# Patient Record
Sex: Male | Born: 1975 | Race: White | Hispanic: No | Marital: Single | Smoking: Never smoker
Health system: Southern US, Community
[De-identification: ages and names within clinical notes are randomized; demographics above are authoritative.]

## PROBLEM LIST (undated history)

## (undated) DIAGNOSIS — U071 COVID-19: Secondary | ICD-10-CM

## (undated) DIAGNOSIS — L649 Androgenic alopecia, unspecified: Secondary | ICD-10-CM

## (undated) HISTORY — DX: COVID-19: U07.1

## (undated) HISTORY — DX: Androgenic alopecia, unspecified: L64.9

---

## 2011-01-11 ENCOUNTER — Ambulatory Visit (INDEPENDENT_AMBULATORY_CARE_PROVIDER_SITE_OTHER): Payer: 59 | Admitting: Family Medicine

## 2011-01-11 DIAGNOSIS — J069 Acute upper respiratory infection, unspecified: Secondary | ICD-10-CM

## 2016-12-17 ENCOUNTER — Encounter: Payer: Self-pay | Admitting: Vascular Surgery

## 2016-12-23 ENCOUNTER — Encounter: Payer: Self-pay | Admitting: Vascular Surgery

## 2018-04-22 ENCOUNTER — Encounter: Payer: Self-pay | Admitting: Primary Care

## 2018-04-24 ENCOUNTER — Ambulatory Visit: Payer: Self-pay | Admitting: Primary Care

## 2018-04-29 ENCOUNTER — Ambulatory Visit: Payer: No Typology Code available for payment source | Admitting: Primary Care

## 2018-04-29 ENCOUNTER — Encounter: Payer: Self-pay | Admitting: Primary Care

## 2018-04-29 ENCOUNTER — Encounter (INDEPENDENT_AMBULATORY_CARE_PROVIDER_SITE_OTHER): Payer: Self-pay

## 2018-04-29 VITALS — BP 130/76 | HR 80 | Temp 98.7°F | Ht 67.5 in | Wt 150.8 lb

## 2018-04-29 DIAGNOSIS — L649 Androgenic alopecia, unspecified: Secondary | ICD-10-CM

## 2018-04-29 DIAGNOSIS — Z0000 Encounter for general adult medical examination without abnormal findings: Secondary | ICD-10-CM | POA: Diagnosis not present

## 2018-04-29 MED ORDER — FINASTERIDE 1 MG PO TABS: 1.0000 mg | ORAL_TABLET | Freq: Every day | ORAL | 3 refills | Status: DC

## 2018-04-29 NOTE — Assessment & Plan Note (Signed)
He will check on Tetanus status. Gets annual flu shots. Recommended he incorporate vegetables, fruit, whole grains into diet. Commended him on regular exercise.  Exam unremarkable.  Labs pending, he will return when fasting. Follow up in 1 year for CPE.

## 2018-04-29 NOTE — Patient Instructions (Signed)
Continue exercising. You should be getting 150 minutes of moderate intensity exercise weekly.  It's important to improve your diet by increasing consumption of fresh vegetables and fruits, whole grains.  Ensure you are drinking 64 ounces of water daily.  Schedule a lab only appointment to return fasting for labs. You may have water and black coffee only.  Follow up in 1 year for your annual exam.  It was a pleasure to meet you today! Please don't hesitate to call or message me with any questions. Welcome to [facility]!   Preventive Care 40-64 Years, Male Preventive care refers to lifestyle choices and visits with your health care provider that can promote health and wellness. What does preventive care include?  A yearly physical exam. This is also called an annual well check.  Dental exams once or twice a year.  Routine eye exams. Ask your health care provider how often you should have your eyes checked.  Personal lifestyle choices, including: ? Daily care of your teeth and gums. ? Regular physical activity. ? Eating a healthy diet. ? Avoiding tobacco and drug use. ? Limiting alcohol use. ? Practicing safe sex. ? Taking low-dose aspirin every day starting at age 66. What happens during an annual well check? The services and screenings done by your health care provider during your annual well check will depend on your age, overall health, lifestyle risk factors, and family history of disease. Counseling Your health care provider may ask you questions about your:  Alcohol use.  Tobacco use.  Drug use.  Emotional well-being.  Home and relationship well-being.  Sexual activity.  Eating habits.  Work and work Statistician.  Screening You may have the following tests or measurements:  Height, weight, and BMI.  Blood pressure.  Lipid and cholesterol levels. These may be checked every 5 years, or more frequently if you are over 66 years old.  Skin check.  Lung  cancer screening. You may have this screening every year starting at age 25 if you have a 30-pack-year history of smoking and currently smoke or have quit within the past 15 years.  Fecal occult blood test (FOBT) of the stool. You may have this test every year starting at age 37.  Flexible sigmoidoscopy or colonoscopy. You may have a sigmoidoscopy every 5 years or a colonoscopy every 10 years starting at age 65.  Prostate cancer screening. Recommendations will vary depending on your family history and other risks.  Hepatitis C blood test.  Hepatitis B blood test.  Sexually transmitted disease (STD) testing.  Diabetes screening. This is done by checking your blood sugar (glucose) after you have not eaten for a while (fasting). You may have this done every 1-3 years.  Discuss your test results, treatment options, and if necessary, the need for more tests with your health care provider. Vaccines Your health care provider may recommend certain vaccines, such as:  Influenza vaccine. This is recommended every year.  Tetanus, diphtheria, and acellular pertussis (Tdap, Td) vaccine. You may need a Td booster every 10 years.  Varicella vaccine. You may need this if you have not been vaccinated.  Zoster vaccine. You may need this after age 65.  Measles, mumps, and rubella (MMR) vaccine. You may need at least one dose of MMR if you were born in 1957 or later. You may also need a second dose.  Pneumococcal 13-valent conjugate (PCV13) vaccine. You may need this if you have certain conditions and have not been vaccinated.  Pneumococcal polysaccharide (PPSV23) vaccine.  You may need one or two doses if you smoke cigarettes or if you have certain conditions.  Meningococcal vaccine. You may need this if you have certain conditions.  Hepatitis A vaccine. You may need this if you have certain conditions or if you travel or work in places where you may be exposed to hepatitis A.  Hepatitis B vaccine.  You may need this if you have certain conditions or if you travel or work in places where you may be exposed to hepatitis B.  Haemophilus influenzae type b (Hib) vaccine. You may need this if you have certain risk factors.  Talk to your health care provider about which screenings and vaccines you need and how often you need them. This information is not intended to replace advice given to you by your health care provider. Make sure you discuss any questions you have with your health care provider. Document Released: 12/15/2015 Document Revised: 08/07/2016 Document Reviewed: 09/19/2015 Elsevier Interactive Patient Education  [employer].

## 2018-04-29 NOTE — Progress Notes (Signed)
Subjective:    Patient ID: Stanley Ponce, male    DOB: 1975/12/18, 42 y.o.   MRN: [id number]  HPI  [name] is a 42 year old male who presents today to establish care, medication refill, and physical.   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 He is needing a refill today.    Immunizations: -Tetanus: Unsure -Influenza: Completes annually    Diet: He endorses a fair diet. Breakfast: Steak, eggs Lunch: Steak Dinner: Steak Snacks: None Desserts: None Beverages: Selter water, occasional alcohol  Exercise: Works out 5 days weekly at [employer] exam: Completed years ago Dental exam: No recent exam    Review of Systems  Constitutional: Negative for unexpected weight change.  HENT: Negative for rhinorrhea.   Respiratory: Negative for cough and shortness of breath.   Cardiovascular: Negative for chest pain.  Gastrointestinal: Negative for constipation and diarrhea.  Genitourinary: Negative for difficulty urinating.  Musculoskeletal: Negative for arthralgias and myalgias.  Skin: Negative for rash.  Allergic/Immunologic: Negative for environmental allergies.  Neurological: Negative for dizziness, numbness and headaches.  Psychiatric/Behavioral: The patient is not nervous/anxious.        Past Medical History:  Diagnosis Date  . Androgenic alopecia      Social History   Socioeconomic History  . Marital status: Married    Spouse name: Not on file  . Number of children: Not on file  . Years of education: Not on file  . Highest education level: Not on file  Occupational History  . Not on file  Social Needs  . Financial resource strain: Not on file  . Food insecurity:    Worry: Not on file    Inability: Not on file  . Transportation needs:    Medical: Not on file    Non-medical: Not on file  Tobacco Use  . Smoking status: Never Smoker  . Smokeless tobacco: Never Used  Substance and Sexual  Activity  . Alcohol use: Never    Frequency: Never  . Drug use: Not on file  . Sexual activity: Not on file  Lifestyle  . Physical activity:    Days per week: Not on file    Minutes per session: Not on file  . Stress: Not on file  Relationships  . Social connections:    Talks on phone: Not on file    Gets together: Not on file    Attends religious service: Not on file    Active member of club or organization: Not on file    Attends meetings of clubs or organizations: Not on file    Relationship status: Not on file  . Intimate partner violence:    Fear of current or ex partner: Not on file    Emotionally abused: Not on file    Physically abused: Not on file    Forced sexual activity: Not on file  Other Topics Concern  . Not on file  Social History Narrative   Separated.    No children.   Works in physical therapy.   Enjoys exercising.    History reviewed. No pertinent surgical history.  Family History  Problem Relation Age of Onset  . Diabetes Father     No Known Allergies  No current outpatient medications on file prior to visit.   No current facility-administered medications on file prior to visit.     BP 130/76   Pulse 80   Temp 98.7 F (37.1  C) (Oral)   Ht 5' 7.5" (1.715 m)   Wt 150 lb 12 oz (68.4 kg)   SpO2 97%   BMI 23.26 kg/m    Objective:   Physical Exam  Constitutional: He is oriented to person, place, and time. He appears well-nourished.  HENT:  Mouth/Throat: No oropharyngeal exudate.  Eyes: Pupils are equal, round, and reactive to light. EOM are normal.  Neck: Neck supple. No thyromegaly present.  Cardiovascular: Normal rate and regular rhythm.  Respiratory: Effort normal and breath sounds normal.  GI: Soft. Bowel sounds are normal. There is no tenderness.  Musculoskeletal: Normal range of motion.  Neurological: He is alert and oriented to person, place, and time.  Skin: Skin is warm and dry.  Psychiatric: He has a normal mood and  affect.           Assessment & Plan:

## 2018-04-29 NOTE — Assessment & Plan Note (Signed)
Chronic for years, doing well on Propecia. Refills sent to pharmacy. CMP pending.

## 2018-05-04 ENCOUNTER — Other Ambulatory Visit (INDEPENDENT_AMBULATORY_CARE_PROVIDER_SITE_OTHER): Payer: No Typology Code available for payment source

## 2018-05-04 DIAGNOSIS — Z0000 Encounter for general adult medical examination without abnormal findings: Secondary | ICD-10-CM | POA: Diagnosis not present

## 2018-05-04 LAB — COMPREHENSIVE METABOLIC PANEL
ALT: 25 U/L (ref 0–53)
AST: 26 U/L (ref 0–37)
Albumin: 4.4 g/dL (ref 3.5–5.2)
Alkaline Phosphatase: 62 U/L (ref 39–117)
BUN: 35 mg/dL — AB (ref 6–23)
CHLORIDE: 101 meq/L (ref 96–112)
CO2: 30 meq/L (ref 19–32)
CREATININE: 0.84 mg/dL (ref 0.40–1.50)
Calcium: 9.3 mg/dL (ref 8.4–10.5)
GFR: 105.41 mL/min (ref >60.00)
Glucose, Bld: 93 mg/dL (ref 70–99)
Potassium: 4.1 mEq/L (ref 3.5–5.1)
SODIUM: 137 meq/L (ref 135–145)
Total Bilirubin: 0.5 mg/dL (ref 0.2–1.2)
Total Protein: 6.6 g/dL (ref 6.0–8.3)

## 2018-05-04 LAB — LIPID PANEL
CHOL/HDL RATIO: 4
Cholesterol: 303 mg/dL — ABNORMAL HIGH (ref 0–200)
HDL: 73.1 mg/dL (ref >39.00)
LDL CALC: 220 mg/dL — AB (ref 0–99)
NONHDL: 230.01
Triglycerides: 49 mg/dL (ref 0.0–149.0)
VLDL: 9.8 mg/dL (ref 0.0–40.0)

## 2018-06-01 ENCOUNTER — Telehealth: Payer: Self-pay | Admitting: Primary Care

## 2018-06-01 ENCOUNTER — Telehealth: Payer: Self-pay

## 2018-06-01 DIAGNOSIS — E7800 Pure hypercholesterolemia, unspecified: Secondary | ICD-10-CM

## 2018-06-01 NOTE — Telephone Encounter (Signed)
Left message asking pt to call office  °

## 2018-06-01 NOTE — Telephone Encounter (Signed)
PLEASE NOTE: All timestamps contained within this report are represented as [place]. CONFIDENTIALTY NOTICE: This fax transmission is intended only for the addressee. It contains information that is legally privileged, confidential or otherwise protected from use or disclosure. If you are not the intended recipient, you are strictly prohibited from reviewing, disclosing, copying using or disseminating any of this information or taking any action in reliance on or regarding this information. If you have received this fax in error, please notify us immediately by telephone so that we can arrange for its return to us. Phone: [phone], Toll-Free: [phone], Fax: [phone] Page: 1 of 1 Call Id: [id number] [facility] Night - Client Nonclinical Telephone Record [facility] Client [facility] - Client [facility] - Night Physician [facility] - PHYSICIAN, Crissie FiguresUNKNOWN- MD Contact Type Call Who Is Calling Patient / Member / Family / Caregiver Caller Name Stanley Ponce Caller Phone Number [phone] Patient Name Stanley Ponce Patient DOB 1976/07/16 Call Type Message Only Information Provided Reason for Call Request to Schedule Office Appointment Initial Comment Caller says he had blood last month and wants to make an apt on Tuesday to have it rechecked. No Sx. Additional Comment Advised to cb first thing in the morning on Monday. Call Closed By: Einar Pheasantosie Noriega Transaction Date/Time: 05/30/2018 2:55:19 PM (ET)

## 2018-06-01 NOTE — Addendum Note (Signed)
Addended by: Lorre MunroeBAITY, Corianna Avallone W on: 06/01/2018 11:32 AM   Modules accepted: Orders

## 2018-06-01 NOTE — Telephone Encounter (Signed)
Pt aware Stanley Ponce will be out of office until 7/10 Already send message to chan and Stanley Ponce Pt stated he has changed his diet

## 2018-06-01 NOTE — Telephone Encounter (Signed)
Spoke to pt he is aware Jae Direkate will be out off till 7/10 He stated he is not taking any meds but he has changed his diet and wanted to see if his chol numbers better

## 2018-06-01 NOTE — Telephone Encounter (Signed)
Lipid panel ordered, can make lab only appt

## 2018-06-01 NOTE — Telephone Encounter (Signed)
Copied from CRM [phone]#[id number]. Topic: Quick Communication - See Telephone Encounter >> Jun 01, 2018  9:42 AM Arlyss Gandyichardson, Yi Haugan N, NT wrote: CRM for notification. See Telephone encounter for: 06/01/18. Pt would like to see if Vernona RiegerKatherine Clark can put in a order for him to have labs drawn to see if his numbers have improved.

## 2018-06-02 NOTE — Telephone Encounter (Signed)
Patient also would like his testosterone check. Can we place the order for that too? The lab appt is on 06/03/2018

## 2018-06-02 NOTE — Telephone Encounter (Signed)
Yes, but need to know why he wants it checked. Fatigue, sexual dysfunction, decreased libido? Also, will have to have labs before 10 am

## 2018-06-03 ENCOUNTER — Other Ambulatory Visit (INDEPENDENT_AMBULATORY_CARE_PROVIDER_SITE_OTHER): Payer: No Typology Code available for payment source

## 2018-06-03 DIAGNOSIS — R7989 Other specified abnormal findings of blood chemistry: Secondary | ICD-10-CM | POA: Diagnosis not present

## 2018-06-03 DIAGNOSIS — E7800 Pure hypercholesterolemia, unspecified: Secondary | ICD-10-CM | POA: Diagnosis not present

## 2018-06-03 LAB — LIPID PANEL
CHOL/HDL RATIO: 3
CHOLESTEROL: 190 mg/dL (ref 0–200)
HDL: 65.4 mg/dL (ref >39.00)
LDL CALC: 117 mg/dL — AB (ref 0–99)
NonHDL: 124.88
TRIGLYCERIDES: 40 mg/dL (ref 0.0–149.0)
VLDL: 8 mg/dL (ref 0.0–40.0)

## 2018-06-03 LAB — TESTOSTERONE: Testosterone: 315.01 ng/dL (ref 300.00–890.00)

## 2018-06-03 NOTE — Telephone Encounter (Signed)
Testosterone was added by lab, results already reviewed, see result note

## 2018-06-03 NOTE — Telephone Encounter (Signed)
Patient stated he has bee having decreased libido.

## 2019-01-20 ENCOUNTER — Other Ambulatory Visit: Payer: Self-pay | Admitting: Primary Care

## 2019-01-20 ENCOUNTER — Ambulatory Visit (INDEPENDENT_AMBULATORY_CARE_PROVIDER_SITE_OTHER): Payer: No Typology Code available for payment source | Admitting: Primary Care

## 2019-01-20 VITALS — BP 130/84 | HR 68 | Temp 97.9°F | Ht 67.5 in | Wt 162.8 lb

## 2019-01-20 DIAGNOSIS — I809 Phlebitis and thrombophlebitis of unspecified site: Secondary | ICD-10-CM | POA: Insufficient documentation

## 2019-01-20 DIAGNOSIS — N63 Unspecified lump in unspecified breast: Secondary | ICD-10-CM | POA: Diagnosis not present

## 2019-01-20 HISTORY — DX: Phlebitis and thrombophlebitis of unspecified site: I80.9

## 2019-01-20 NOTE — Patient Instructions (Signed)
Stop by the front desk and speak with Zella Ball regarding your referral to breast ultrasound.  Start Ibuprofen 600 mg three times daily or naproxen 500 mg twice daily for the inflammation and discomfort to the varicose vein.  Please notify me if you see increased redness (especially to the calf), swelling, pain to the back of the calf.  It was a pleasure to see you today!

## 2019-01-20 NOTE — Assessment & Plan Note (Signed)
Evident on exam over left lower extremity varicose vein. Do not suspect DVT.  Recommended NSAID's and cool compresses. Return precautions provided.

## 2019-01-20 NOTE — Assessment & Plan Note (Addendum)
Evident under right nipple. No mass under left nipple. He is on finasteride so given this we will check an ultrasound.  Ultrasound of the right breast pending.

## 2019-01-20 NOTE — Progress Notes (Signed)
Subjective:    Patient ID: Stanley Ponce, male    DOB: 07-29-1976, 43 y.o.   MRN: [id number]  HPI  Stanley Ponce is a 43 year old male who presents today with a chief complaint of nipple pain and mass.  1) Nipple Pain/Mass: Located to the right nipple that he first noticed three weeks ago. The lump is noticed with palpitation only, mild tenderness. He denies redness, swelling. He self diagnosed himself with tendonitis to his bilateral elbows and biceps, these symptoms have nearly resolved. He denies a family history of breast cancer. He does exercises regularly, denies taking steroids or testosterone.    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 He woke up this morning and noticed a patch of erythema with discomfort over the left lateral calf over his varicose vein. He denies calf pain, swelling, long travel. He's not been taking anything for his symptoms.   Review of Systems  Constitutional: Negative for fever.  Skin: Positive for color change. Negative for wound.       Nipple mass  Neurological: Negative for numbness.       Past Medical History:  Diagnosis Date  . Androgenic alopecia      Social History   Socioeconomic History  . Marital status: Married    Spouse name: Not on file  . Number of children: Not on file  . Years of education: Not on file  . Highest education level: Not on file  Occupational History  . Not on file  Social Needs  . Financial resource strain: Not on file  . Food insecurity:    Worry: Not on file    Inability: Not on file  . Transportation needs:    Medical: Not on file    Non-medical: Not on file  Tobacco Use  . Smoking status: Never Smoker  . Smokeless tobacco: Never Used  Substance and Sexual Activity  . Alcohol use: Never    Frequency: Never  . Drug use: Not on file  . Sexual activity: Not on file  Lifestyle  . Physical activity:    Days per week: Not on file    Minutes per session: Not on file  . Stress: Not on file    Relationships  . Social connections:    Talks on phone: Not on file    Gets together: Not on file    Attends religious service: Not on file    Active member of club or organization: Not on file    Attends meetings of clubs or organizations: Not on file    Relationship status: Not on file  . Intimate partner violence:    Fear of current or ex partner: Not on file    Emotionally abused: Not on file    Physically abused: Not on file    Forced sexual activity: Not on file  Other Topics Concern  . Not on file  Social History Narrative   Separated.    No children.   Works in physical therapy.   Enjoys exercising.    No past surgical history on file.  Family History  Problem Relation Age of Onset  . Diabetes Father     No Known Allergies  Current Outpatient Medications on File Prior to Visit  Medication Sig Dispense Refill  . finasteride (PROPECIA) 1 MG tablet Take 1 tablet (1 mg total) by mouth daily. 90 tablet 3   No current facility-administered medications on file prior to visit.     BP 130/84  Pulse 68   Temp 97.9 F (36.6 C) (Oral)   Ht 5' 7.5" (1.715 m)   Wt 162 lb 12 oz (73.8 kg)   SpO2 99%   BMI 25.11 kg/m    Objective:   Physical Exam  Constitutional: He appears well-nourished.  Cardiovascular: Normal rate.  No posterior calf swelling or tenderness. Numerous varicose veins noted to bilateral lower extremities.   Respiratory: Right breast exhibits mass. Right breast exhibits no nipple discharge, no skin change and no tenderness. Left breast exhibits no mass, no nipple discharge, no skin change and no tenderness.    Mass is under right nipple  Skin: Skin is warm and dry. There is erythema.  Mild erythema to left lateral calf over varicose vein. Tender upon palpation.            Assessment & Plan:

## 2019-01-22 ENCOUNTER — Other Ambulatory Visit: Payer: No Typology Code available for payment source

## 2019-01-22 ENCOUNTER — Other Ambulatory Visit: Payer: Self-pay | Admitting: Primary Care

## 2019-01-22 DIAGNOSIS — N63 Unspecified lump in unspecified breast: Secondary | ICD-10-CM

## 2019-01-25 ENCOUNTER — Telehealth: Payer: Self-pay | Admitting: Primary Care

## 2019-01-25 DIAGNOSIS — M79605 Pain in left leg: Secondary | ICD-10-CM

## 2019-01-25 NOTE — Telephone Encounter (Signed)
Please thank him for the update. Is he willing to have an ultrasound of the leg to rule out DVT? I think it's the next best step.

## 2019-01-25 NOTE — Telephone Encounter (Signed)
Best number [phone]  I called pt regarding his ultra sound for his breast.   And he wanted me to let you know the spot on his leg is still swollen

## 2019-01-25 NOTE — Telephone Encounter (Signed)
Noted, order placed. 

## 2019-01-25 NOTE — Telephone Encounter (Signed)
Per DPR, left detail message of Kate Clark's comments for patient to call back 

## 2019-01-25 NOTE — Telephone Encounter (Signed)
Spoke to pt, he stated he thought [place] was in network but he wanted to double check with insurance company.  He said to give him a few days and he would call me back to schedule appointment

## 2019-01-25 NOTE — Telephone Encounter (Signed)
Patient called back and stated that he is agreeable to the ultrasound of the leg.

## 2019-01-25 NOTE — Telephone Encounter (Signed)
Left message asking pt to call office regarding ultra sound  Please transfer to Robin   Mammogram and ultra sound scheduled for 2/21  Per appointment notes pt canceled appointment  See below reason for cancelation  Patient (PT'S INSURANCE IS OUT OF NETWORK, WILL CONTACT PROVIDER TO SEE WHAT TO DO NEXT AND POSSIBLY CHECK W/PARTICULAR PLAN FOR IN NETWORK PROVIDER. GAVE HIM [phone] AND [phone] AS WELL AS [phone] TO GIVE TO INSURANCE)

## 2019-01-29 NOTE — Telephone Encounter (Signed)
Patient made appointment  [place] 3/4

## 2019-02-01 ENCOUNTER — Other Ambulatory Visit (INDEPENDENT_AMBULATORY_CARE_PROVIDER_SITE_OTHER): Payer: No Typology Code available for payment source

## 2019-02-01 DIAGNOSIS — R0602 Shortness of breath: Secondary | ICD-10-CM

## 2019-02-01 DIAGNOSIS — I83813 Varicose veins of bilateral lower extremities with pain: Secondary | ICD-10-CM

## 2019-02-02 ENCOUNTER — Telehealth: Payer: Self-pay | Admitting: Primary Care

## 2019-02-02 LAB — D-DIMER, QUANTITATIVE: D-Dimer, Quant: 0.99 mcg/mL FEU — ABNORMAL HIGH (ref <0.50)

## 2019-02-02 NOTE — Telephone Encounter (Signed)
Called patient to discuss d-dimer results and ED precautions, no answer. Voice mail left discussing to review my chart message. ED precautions provided, will await ultrasound report.

## 2019-02-03 ENCOUNTER — Ambulatory Visit
Admission: RE | Admit: 2019-02-03 | Discharge: 2019-02-03 | Disposition: A | Discharge status: Home / Self Care | Payer: PRIVATE HEALTH INSURANCE | Source: Ambulatory Visit | Attending: Primary Care | Admitting: Primary Care

## 2019-02-03 ENCOUNTER — Ambulatory Visit (INDEPENDENT_AMBULATORY_CARE_PROVIDER_SITE_OTHER): Payer: PRIVATE HEALTH INSURANCE

## 2019-02-03 DIAGNOSIS — N63 Unspecified lump in unspecified breast: Secondary | ICD-10-CM | POA: Diagnosis not present

## 2019-02-03 DIAGNOSIS — M79605 Pain in left leg: Secondary | ICD-10-CM | POA: Diagnosis not present

## 2019-02-03 NOTE — Progress Notes (Signed)
Patient ID: Stanley Ponce, male   DOB: October 22, 1976, 43 y.o.   MRN: [id number]    Left lower venous has been completed and is negative for DVT. Preliminary results can be found under CV proc through chart review.   Dondra Prader RVT [facility]

## 2019-04-17 ENCOUNTER — Other Ambulatory Visit: Payer: Self-pay | Admitting: Primary Care

## 2019-04-17 DIAGNOSIS — L649 Androgenic alopecia, unspecified: Secondary | ICD-10-CM

## 2019-07-16 ENCOUNTER — Other Ambulatory Visit: Payer: Self-pay | Admitting: Primary Care

## 2019-07-16 DIAGNOSIS — L649 Androgenic alopecia, unspecified: Secondary | ICD-10-CM

## 2019-08-08 ENCOUNTER — Other Ambulatory Visit: Payer: Self-pay

## 2019-08-08 DIAGNOSIS — L649 Androgenic alopecia, unspecified: Secondary | ICD-10-CM

## 2019-08-10 MED ORDER — FINASTERIDE 1 MG PO TABS: 1.0000 mg | ORAL_TABLET | Freq: Every day | ORAL | 0 refills | Status: DC

## 2019-09-06 ENCOUNTER — Other Ambulatory Visit: Payer: Self-pay | Admitting: Primary Care

## 2019-09-06 DIAGNOSIS — L649 Androgenic alopecia, unspecified: Secondary | ICD-10-CM

## 2019-09-20 ENCOUNTER — Other Ambulatory Visit: Payer: Self-pay | Admitting: Primary Care

## 2019-09-20 DIAGNOSIS — Z0000 Encounter for general adult medical examination without abnormal findings: Secondary | ICD-10-CM

## 2019-09-27 ENCOUNTER — Telehealth: Payer: Self-pay

## 2019-09-27 NOTE — Telephone Encounter (Signed)
LVM w COVID screen, back lab and front door info  

## 2019-09-27 NOTE — Telephone Encounter (Signed)
[facility] Night - Client TELEPHONE ADVICE RECORD [facility] Patient Name: Stanley Ponce Gender: Male DOB: 09-29-1976 Age: 43 Y 7 M Return Phone Number: [phone] (Primary) Address: City/State/Zip: [place] [address] Client [facility] Night - Client Client Site [facility] - Night Physician Vernona Rieger - NP Contact Type Call Who Is Calling Patient / Member / Family / Caregiver Call Type Triage / Clinical Relationship To Patient Self Return Phone Number [phone] (Primary) Chief Complaint Health information question (non symptomatic) Reason for Call Symptomatic / Request for Health Information Initial Comment Caller states that he has lab appointment and needs to do the Covid-19 prescreening before he can go into the building on the 09/29/2019. Caller is non symptomatic. Translation No Nurse Assessment Nurse: Terrence Dupont, RN, Holly Date/Time (Eastern Time): 09/24/2019 6:54:55 PM Confirm and document reason for call. If symptomatic, describe symptoms. ---Caller states that he has lab appointment and needs to do the Covid-19 prescreening before he can go into the building on the 09/29/2019. Caller is non symptomatic. caller states he gets tested weekly for COVID. no resident at the facility is positive at this time. Has the patient had close contact with a person known or suspected to have the novel coronavirus illness OR traveled / lives in area with major community spread (including international travel) in the last 14 days from the onset of symptoms? * If Asymptomatic, screen for exposure and travel within the last 14 days. ---No Does the patient have any new or worsening symptoms? ---No Guidelines Guideline Title Affirmed Question Affirmed Notes Nurse Date/Time (Eastern Time) Coronavirus (COVID-19) - Exposure COVID -19 Disease, questions about McClarnon, RN, [facility] 09/24/2019 6:56:18  PM Disp. Time Lamount Cohen Time) Disposition Final User 09/24/2019 6:37:19 PM Send To RN Personal Mendel Corning, RN, Kaila 09/24/2019 6:59:16 PM Home Care Yes McClarnon, RN, Sharene Butters Disagree/Comply Comply PLEASE NOTE: All timestamps contained within this report are represented as [place] Standard Time. CONFIDENTIALTY NOTICE: This fax transmission is intended only for the addressee. It contains information that is legally privileged, confidential or otherwise protected from use or disclosure. If you are not the intended recipient, you are strictly prohibited from reviewing, disclosing, copying using or disseminating any of this information or taking any action in reliance on or regarding this information. If you have received this fax in error, please notify us immediately by telephone so that we can arrange for its return to [place]. Phone: [phone], Toll-Free: [phone], Fax: [phone] Page: 2 of 2 Call Id: [id number] Caller Understands Yes PreDisposition Did not know what to do Care Advice Given Per Guideline * LIVING IN OR TRAVEL FROM A CITY or area where there is community spread of COVID-19. This carries a lower risk compared to close contact if one follows physical distancing recommendations. Community spread is now occurring in most of the [place], especially in cities. * CLOSE CONTACT WITH A PERSON who tested positive for COVID-19 AND contact occurred while they were ill. * Here are the main risk factors for getting sick with COVID-19. COVID-19 - EXPOSURE RISK FACTORS: * You have more questions. CALL BACK IF: CARE ADVICE given per Coronavirus (COVID-19) - Exposure (Adult) guideline. Comments User: Donzetta Matters, RN Date/Time Lamount Cohen Time): 09/24/2019 7:00:17 PM Instructed when he comes to the office on Wednesday they will take his temperature an ask him if he has any symptoms. That is the screening before he has his labs drawn. User: Donzetta Matters, RN Date/Time Lamount Cohen Time):  09/24/2019 7:01:10 PM  Caller states he is tested weekly for COVID at his Job on Thursday's he has always been negative

## 2019-09-29 ENCOUNTER — Other Ambulatory Visit (INDEPENDENT_AMBULATORY_CARE_PROVIDER_SITE_OTHER): Payer: PRIVATE HEALTH INSURANCE

## 2019-09-29 DIAGNOSIS — Z0000 Encounter for general adult medical examination without abnormal findings: Secondary | ICD-10-CM | POA: Diagnosis not present

## 2019-09-29 LAB — COMPREHENSIVE METABOLIC PANEL
ALT: 50 U/L (ref 0–53)
AST: 33 U/L (ref 0–37)
Albumin: 4.6 g/dL (ref 3.5–5.2)
Alkaline Phosphatase: 80 U/L (ref 39–117)
BUN: 36 mg/dL — ABNORMAL HIGH (ref 6–23)
CO2: 28 mEq/L (ref 19–32)
Calcium: 9.6 mg/dL (ref 8.4–10.5)
Chloride: 102 mEq/L (ref 96–112)
Creatinine, Ser: 0.94 mg/dL (ref 0.40–1.50)
GFR: 86.55 mL/min (ref >60.00)
Glucose, Bld: 77 mg/dL (ref 70–99)
Potassium: 4.7 mEq/L (ref 3.5–5.1)
Sodium: 138 mEq/L (ref 135–145)
Total Bilirubin: 0.7 mg/dL (ref 0.2–1.2)
Total Protein: 6.5 g/dL (ref 6.0–8.3)

## 2019-09-29 LAB — CBC
HCT: 44.7 % (ref 39.0–52.0)
Hemoglobin: 14.7 g/dL (ref 13.0–17.0)
MCHC: 32.9 g/dL (ref 30.0–36.0)
MCV: 91.4 fl (ref 78.0–100.0)
Platelets: 184 10*3/uL (ref 150.0–400.0)
RBC: 4.89 Mil/uL (ref 4.22–5.81)
RDW: 12.9 % (ref 11.5–15.5)
WBC: 3.2 10*3/uL — ABNORMAL LOW (ref 4.0–10.5)

## 2019-09-29 LAB — LIPID PANEL
Cholesterol: 217 mg/dL — ABNORMAL HIGH (ref 0–200)
HDL: 56.9 mg/dL (ref >39.00)
LDL Cholesterol: 149 mg/dL — ABNORMAL HIGH (ref 0–99)
NonHDL: 160.13
Total CHOL/HDL Ratio: 4
Triglycerides: 58 mg/dL (ref 0.0–149.0)
VLDL: 11.6 mg/dL (ref 0.0–40.0)

## 2019-10-02 ENCOUNTER — Other Ambulatory Visit: Payer: Self-pay | Admitting: Primary Care

## 2019-10-02 DIAGNOSIS — L649 Androgenic alopecia, unspecified: Secondary | ICD-10-CM

## 2019-10-06 ENCOUNTER — Encounter: Payer: Self-pay | Admitting: Primary Care

## 2019-10-06 ENCOUNTER — Other Ambulatory Visit: Payer: Self-pay

## 2019-10-06 ENCOUNTER — Ambulatory Visit (INDEPENDENT_AMBULATORY_CARE_PROVIDER_SITE_OTHER): Payer: PRIVATE HEALTH INSURANCE | Admitting: Primary Care

## 2019-10-06 VITALS — BP 122/80 | HR 65 | Temp 97.4°F | Ht 67.5 in | Wt 149.8 lb

## 2019-10-06 DIAGNOSIS — Z0000 Encounter for general adult medical examination without abnormal findings: Secondary | ICD-10-CM | POA: Diagnosis not present

## 2019-10-06 DIAGNOSIS — E785 Hyperlipidemia, unspecified: Secondary | ICD-10-CM | POA: Diagnosis not present

## 2019-10-06 DIAGNOSIS — D72819 Decreased white blood cell count, unspecified: Secondary | ICD-10-CM | POA: Insufficient documentation

## 2019-10-06 DIAGNOSIS — L649 Androgenic alopecia, unspecified: Secondary | ICD-10-CM

## 2019-10-06 DIAGNOSIS — Z23 Encounter for immunization: Secondary | ICD-10-CM | POA: Diagnosis not present

## 2019-10-06 MED ORDER — FINASTERIDE 1 MG PO TABS: 1.0000 mg | ORAL_TABLET | Freq: Every day | ORAL | 3 refills | Status: DC

## 2019-10-06 NOTE — Assessment & Plan Note (Addendum)
Chronic, managed on finasteride for 15 years, doing well. Continue same. Refills sent to pharmacy. Check PSA in 2 months.

## 2019-10-06 NOTE — Progress Notes (Signed)
Subjective:    Patient ID: Stanley Ponce, male    DOB: 1976/10/11, 43 y.o.   MRN: [id number]  HPI  Stanley Ponce is a 43 year old male who presents today for complete physical.  Immunizations: -Tetanus: Due -Influenza: Completed this season   Diet: He endorses a fair diet overall.  Exercise: He is doing strength training 5 days weekly, no cardio.  Eye exam: No recent exam Dental exam: No recent exam  Colonoscopy: Never completed, declines due to Covid-19  BP Readings from Last 3 Encounters:  10/06/19 122/80  01/20/19 130/84  04/29/18 130/76     Review of Systems  Constitutional: Negative for unexpected weight change.  HENT: Negative for rhinorrhea.   Respiratory: Negative for cough and shortness of breath.   Cardiovascular: Negative for chest pain.  Gastrointestinal: Negative for constipation and diarrhea.  Genitourinary: Negative for difficulty urinating.  Musculoskeletal: Negative for arthralgias and myalgias.  Skin: Negative for rash.  Allergic/Immunologic: Negative for environmental allergies.  Neurological: Negative for dizziness, numbness and headaches.  Psychiatric/Behavioral: The patient is not nervous/anxious.        Past Medical History:  Diagnosis Date  . Androgenic alopecia      Social History   Socioeconomic History  . Marital status: Married    Spouse name: Not on file  . Number of children: Not on file  . Years of education: Not on file  . Highest education level: Not on file  Occupational History  . Not on file  Social Needs  . Financial resource strain: Not on file  . Food insecurity    Worry: Not on file    Inability: Not on file  . Transportation needs    Medical: Not on file    Non-medical: Not on file  Tobacco Use  . Smoking status: Never Smoker  . Smokeless tobacco: Never Used  Substance and Sexual Activity  . Alcohol use: Never    Frequency: Never  . Drug use: Not on file  . Sexual activity: Not on file  Lifestyle   . Physical activity    Days per week: Not on file    Minutes per session: Not on file  . Stress: Not on file  Relationships  . Social Herbalist on phone: Not on file    Gets together: Not on file    Attends religious service: Not on file    Active member of club or organization: Not on file    Attends meetings of clubs or organizations: Not on file    Relationship status: Not on file  . Intimate partner violence    Fear of current or ex partner: Not on file    Emotionally abused: Not on file    Physically abused: Not on file    Forced sexual activity: Not on file  Other Topics Concern  . Not on file  Social History Narrative   Separated.    No children.   Works in physical therapy.   Enjoys exercising.    No past surgical history on file.  Family History  Problem Relation Age of Onset  . Diabetes Father   . Breast cancer Neg Hx     No Known Allergies  Current Outpatient Medications on File Prior to Visit  Medication Sig Dispense Refill  . finasteride (PROPECIA) 1 MG tablet TAKE 1 TABLET (1 MG TOTAL) BY MOUTH DAILY. DUE FOR A FOLLOW UP APPOINTMENT 30 tablet 0   No current facility-administered medications on file prior  to visit.     BP 122/80   Pulse 65   Temp (!) 97.4 F (36.3 C) (Temporal)   Ht 5' 7.5" (1.715 m)   Wt 149 lb 12 oz (67.9 kg)   SpO2 100%   BMI 23.11 kg/m    Objective:   Physical Exam  Constitutional: He is oriented to person, place, and time. He appears well-nourished.  HENT:  Right Ear: Tympanic membrane and ear canal normal.  Left Ear: Tympanic membrane and ear canal normal.  Mouth/Throat: Oropharynx is clear and moist.  Eyes: Pupils are equal, round, and reactive to light. EOM are normal.  Neck: Neck supple.  Cardiovascular: Normal rate and regular rhythm.  Respiratory: Effort normal and breath sounds normal.  GI: Soft. Bowel sounds are normal. There is no abdominal tenderness.  Musculoskeletal: Normal range of motion.   Neurological: He is alert and oriented to person, place, and time.  Skin: Skin is warm and dry.  Psychiatric: He has a normal mood and affect.           Assessment & Plan:

## 2019-10-06 NOTE — Assessment & Plan Note (Signed)
Tetanus due, provided today. Influenza vaccination UTD. Colonoscopy due, he declines due to Covid-19, will address again next year. Encouraged a healthy diet and regular exercise. Exam today unremarkable.  Labs reviewed.

## 2019-10-06 NOTE — Assessment & Plan Note (Addendum)
Noted on recent labs with WBC count of 3.2. Based off of records from 2018 WBC count was 9.4. Will repeat in 2 months. Asymptomatic.

## 2019-10-06 NOTE — Addendum Note (Signed)
Addended by: Jacqualin Combes on: 10/06/2019 09:39 AM   Modules accepted: Orders

## 2019-10-06 NOTE — Assessment & Plan Note (Addendum)
LDL of 149 on recent labs which is drastically improved from 220 last year. 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 Discussed to initiate cardiovascular exercise during work out routine. Continue to monitor.

## 2019-10-06 NOTE — Patient Instructions (Signed)
Continue exercising. You should be getting 150 minutes of moderate intensity exercise weekly.  Continue to work on a healthy diet. Ensure you are consuming 64 ounces of water daily.  Schedule a lab only appointment for 2 months for repeat blood counts.  It was a pleasure to see you today!   Preventive Care 86-45 Years Old, Male Preventive care refers to lifestyle choices and visits with your health care provider that can promote health and wellness. This includes:  A yearly physical exam. This is also called an annual well check.  Regular dental and eye exams.  Immunizations.  Screening for certain conditions.  Healthy lifestyle choices, such as eating a healthy diet, getting regular exercise, not using drugs or products that contain nicotine and tobacco, and limiting alcohol use. What can I expect for my preventive care visit? Physical exam Your health care provider will check:  Height and weight. These may be used to calculate body mass index (BMI), which is a measurement that tells if you are at a healthy weight.  Heart rate and blood pressure.  Your skin for abnormal spots. Counseling Your health care provider may ask you questions about:  Alcohol, tobacco, and drug use.  Emotional well-being.  Home and relationship well-being.  Sexual activity.  Eating habits.  Work and work Statistician. What immunizations do I need?  Influenza (flu) vaccine  This is recommended every year. Tetanus, diphtheria, and pertussis (Tdap) vaccine  You may need a Td booster every 10 years. Varicella (chickenpox) vaccine  You may need this vaccine if you have not already been vaccinated. Zoster (shingles) vaccine  You may need this after age 45. Measles, mumps, and rubella (MMR) vaccine  You may need at least one dose of MMR if you were born in 1957 or later. You may also need a second dose. Pneumococcal conjugate (PCV13) vaccine  You may need this if you have certain  conditions and were not previously vaccinated. Pneumococcal polysaccharide (PPSV23) vaccine  You may need one or two doses if you smoke cigarettes or if you have certain conditions. Meningococcal conjugate (MenACWY) vaccine  You may need this if you have certain conditions. Hepatitis A vaccine  You may need this if you have certain conditions or if you travel or work in places where you may be exposed to hepatitis A. Hepatitis B vaccine  You may need this if you have certain conditions or if you travel or work in places where you may be exposed to hepatitis B. Haemophilus influenzae type b (Hib) vaccine  You may need this if you have certain risk factors. Human papillomavirus (HPV) vaccine  If recommended by your health care provider, you may need three doses over 6 months. You may receive vaccines as individual doses or as more than one vaccine together in one shot (combination vaccines). Talk with your health care provider about the risks and benefits of combination vaccines. What tests do I need? Blood tests  Lipid and cholesterol levels. These may be checked every 5 years, or more frequently if you are over 40 years old.  Hepatitis C test.  Hepatitis B test. Screening  Lung cancer screening. You may have this screening every year starting at age 45 if you have a 30-pack-year history of smoking and currently smoke or have quit within the past 15 years.  Prostate cancer screening. Recommendations will vary depending on your family history and other risks.  Colorectal cancer screening. All adults should have this screening starting at age 45 and  continuing until age 45. Your health care provider may recommend screening at age 45 if you are at increased risk. You will have tests every 1-10 years, depending on your results and the type of screening test.  Diabetes screening. This is done by checking your blood sugar (glucose) after you have not eaten for a while (fasting). You may  have this done every 1-3 years.  Sexually transmitted disease (STD) testing. Follow these instructions at home: Eating and drinking  Eat a diet that includes fresh fruits and vegetables, whole grains, lean protein, and low-fat dairy products.  Take vitamin and mineral supplements as recommended by your health care provider.  Do not drink alcohol if your health care provider tells you not to drink.  If you drink alcohol: ? Limit how much you have to 0-2 drinks a day. ? Be aware of how much alcohol is in your drink. In the [place] one drink equals one 12 oz bottle of beer (355 mL), one 5 oz glass of wine (148 mL), or one 1 oz glass of hard liquor (44 mL). Lifestyle  Take daily care of your teeth and gums.  Stay active. Exercise for at least 30 minutes on 5 or more days each week.  Do not use any products that contain nicotine or tobacco, such as cigarettes, e-cigarettes, and chewing tobacco. If you need help quitting, ask your health care provider.  If you are sexually active, practice safe sex. Use a condom or other form of protection to prevent STIs (sexually transmitted infections).  Talk with your health care provider about taking a low-dose aspirin every day starting at age 45. What's next?  Go to your health care provider once a year for a well check visit.  Ask your health care provider how often you should have your eyes and teeth checked.  Stay up to date on all vaccines. This information is not intended to replace advice given to you by your health care provider. Make sure you discuss any questions you have with your health care provider. Document Released: 12/15/2015 Document Revised: 11/12/2018 Document Reviewed: 11/12/2018 Elsevier Patient Education  2020 [employer].

## 2019-12-01 ENCOUNTER — Telehealth: Payer: Self-pay

## 2019-12-01 NOTE — Telephone Encounter (Signed)
LVM w COVID screen and back lab info 12.30.2020 TLJ 

## 2019-12-07 ENCOUNTER — Other Ambulatory Visit: Payer: Self-pay

## 2019-12-07 ENCOUNTER — Other Ambulatory Visit (INDEPENDENT_AMBULATORY_CARE_PROVIDER_SITE_OTHER): Payer: PRIVATE HEALTH INSURANCE

## 2019-12-07 DIAGNOSIS — L649 Androgenic alopecia, unspecified: Secondary | ICD-10-CM | POA: Diagnosis not present

## 2019-12-07 DIAGNOSIS — D72819 Decreased white blood cell count, unspecified: Secondary | ICD-10-CM

## 2019-12-07 LAB — CBC
HCT: 43.8 % (ref 39.0–52.0)
Hemoglobin: 14.6 g/dL (ref 13.0–17.0)
MCHC: 33.4 g/dL (ref 30.0–36.0)
MCV: 91 fl (ref 78.0–100.0)
Platelets: 154 10*3/uL (ref 150.0–400.0)
RBC: 4.81 Mil/uL (ref 4.22–5.81)
RDW: 14.3 % (ref 11.5–15.5)
WBC: 4.1 10*3/uL (ref 4.0–10.5)

## 2019-12-07 LAB — PSA: PSA: 0.12 ng/mL (ref 0.10–4.00)

## 2019-12-08 ENCOUNTER — Other Ambulatory Visit (INDEPENDENT_AMBULATORY_CARE_PROVIDER_SITE_OTHER): Payer: PRIVATE HEALTH INSURANCE

## 2019-12-08 DIAGNOSIS — E785 Hyperlipidemia, unspecified: Secondary | ICD-10-CM

## 2019-12-08 LAB — LIPID PANEL
Cholesterol: 207 mg/dL — ABNORMAL HIGH (ref 0–200)
HDL: 74.3 mg/dL (ref >39.00)
LDL Cholesterol: 124 mg/dL — ABNORMAL HIGH (ref 0–99)
NonHDL: 132.67
Total CHOL/HDL Ratio: 3
Triglycerides: 43 mg/dL (ref 0.0–149.0)
VLDL: 8.6 mg/dL (ref 0.0–40.0)

## 2020-08-16 DIAGNOSIS — L649 Androgenic alopecia, unspecified: Secondary | ICD-10-CM

## 2020-08-17 ENCOUNTER — Other Ambulatory Visit: Payer: Self-pay

## 2020-08-17 DIAGNOSIS — L649 Androgenic alopecia, unspecified: Secondary | ICD-10-CM

## 2020-08-17 MED ORDER — FINASTERIDE 1 MG PO TABS: 1.0000 mg | ORAL_TABLET | Freq: Every day | ORAL | 0 refills | Status: DC

## 2020-08-17 NOTE — Telephone Encounter (Signed)
Fax received patient on the road and would like 90 day supply sent to [facility] in [place]. I have pended new script with correct pharmacy.

## 2020-09-02 ENCOUNTER — Other Ambulatory Visit: Payer: Self-pay | Admitting: Primary Care

## 2020-09-02 DIAGNOSIS — L649 Androgenic alopecia, unspecified: Secondary | ICD-10-CM

## 2021-01-18 IMAGING — US ULTRASOUND RIGHT BREAST LIMITED
1 series · 9 of 9 positions shown · non-contrast
Comparison: Previous exam(s).

ACR Breast Density Category a: The breast tissue is almost entirely
fatty.

CLINICAL DATA: Right subareolar breast area of palpable concern and
tenderness.

EXAM:
DIGITAL DIAGNOSTIC BILATERAL MAMMOGRAM WITH CAD AND TOMO
ULTRASOUND RIGHT BREAST

[Series 1: ultrasound right breast limited · 0.06mm/px · 9 of 9 slices shown]
[im 1/9]
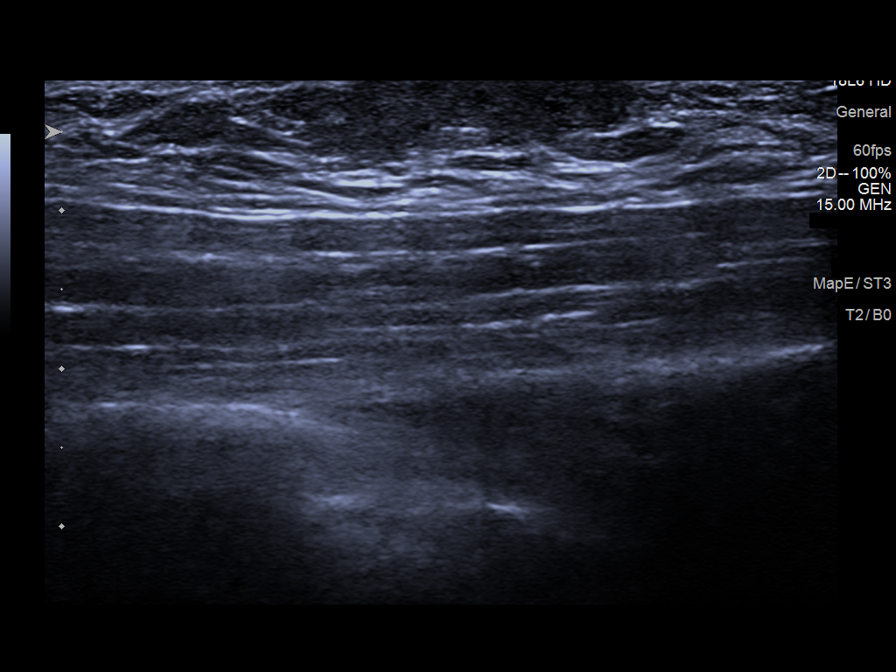
[im 2/9]
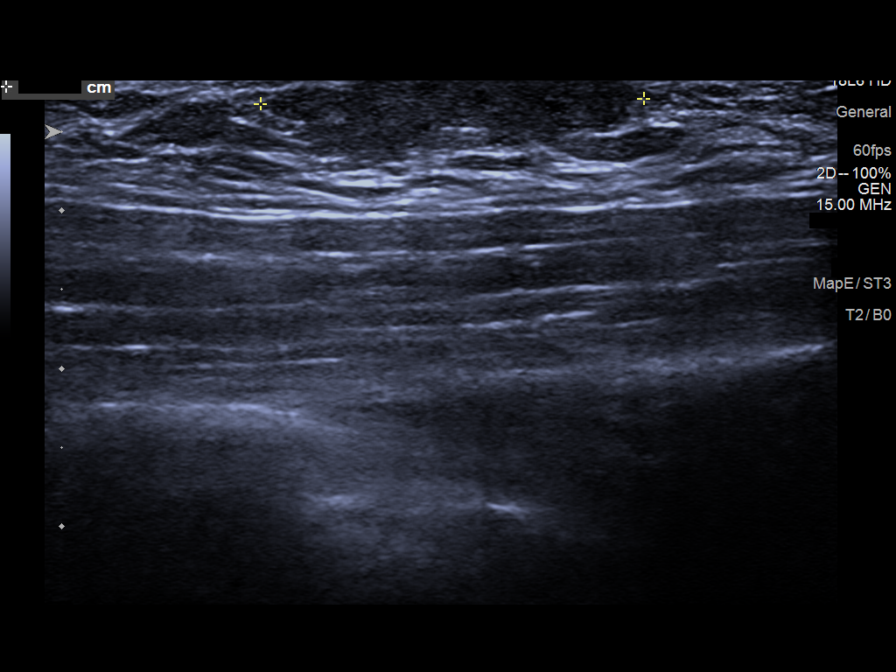
[im 3/9]
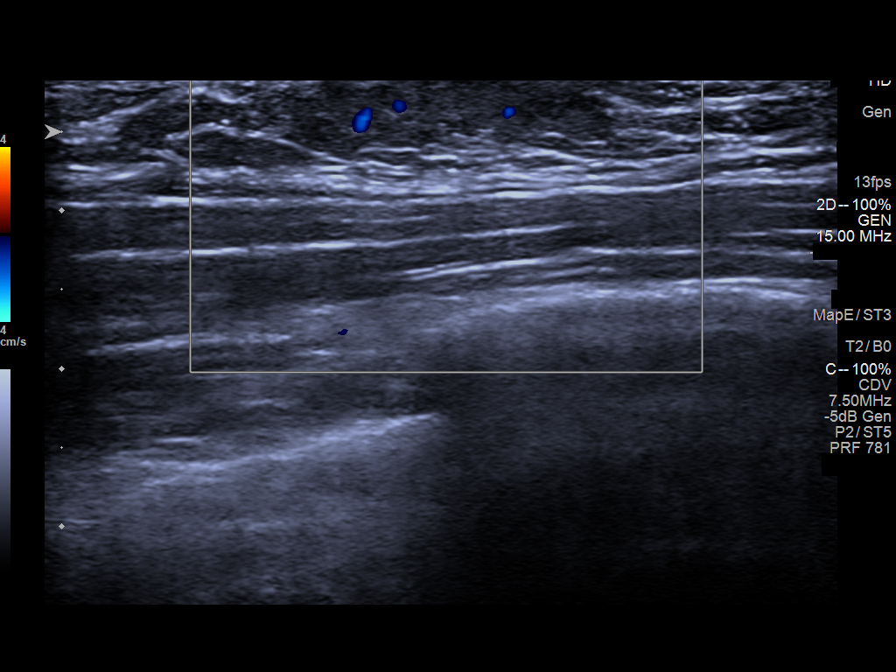
[im 4/9]
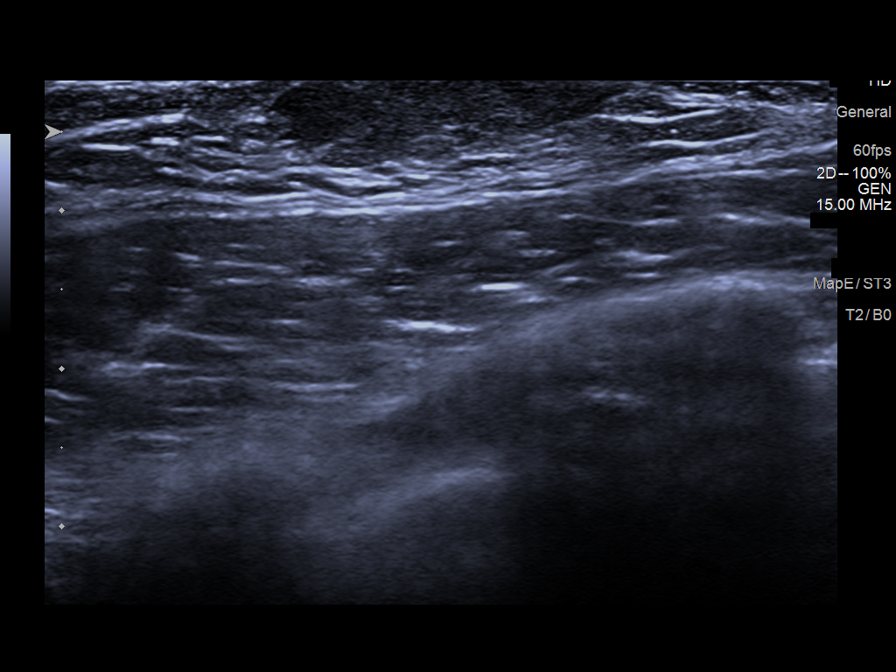
[im 5/9]
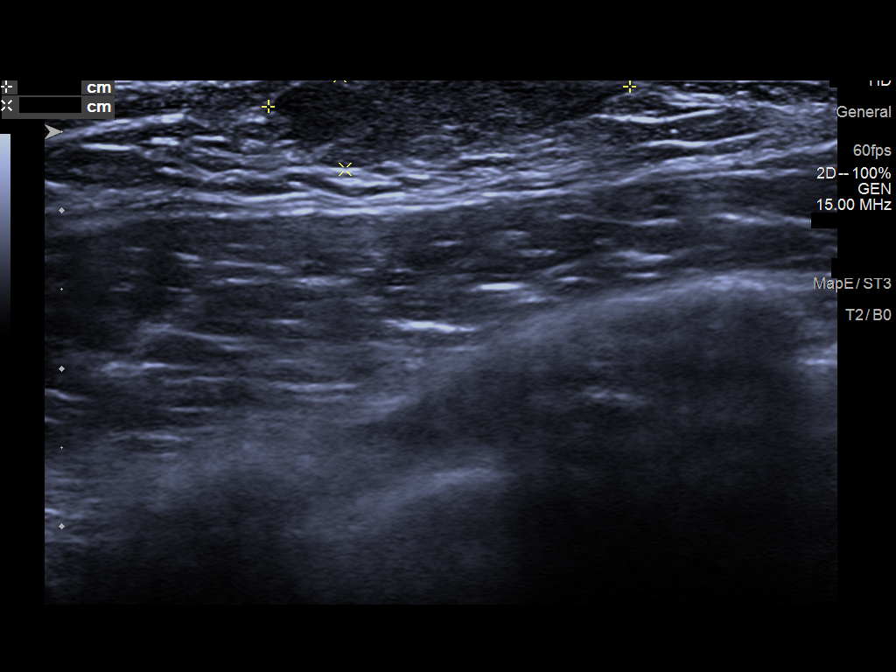
[im 6/9]
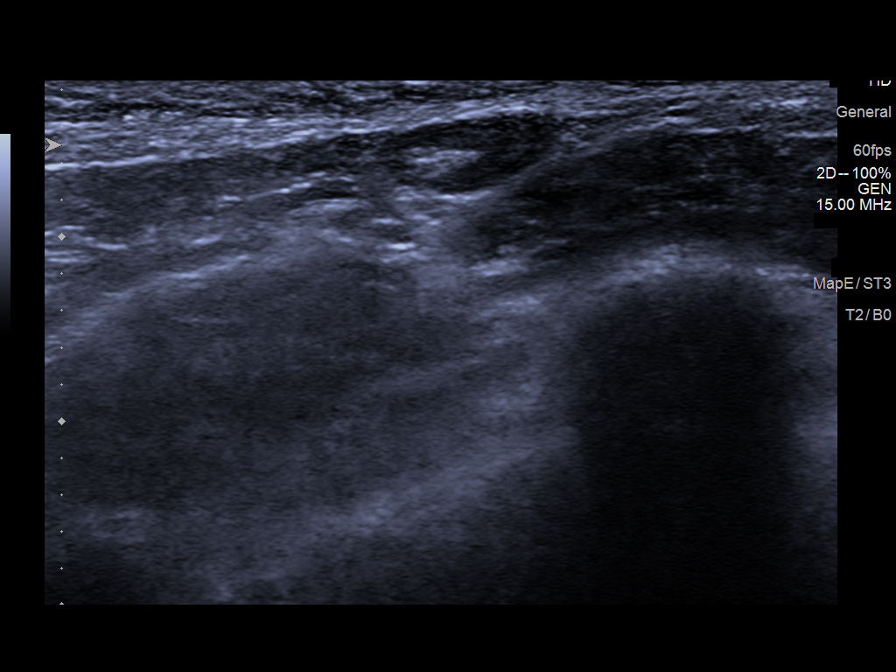
[im 7/9]
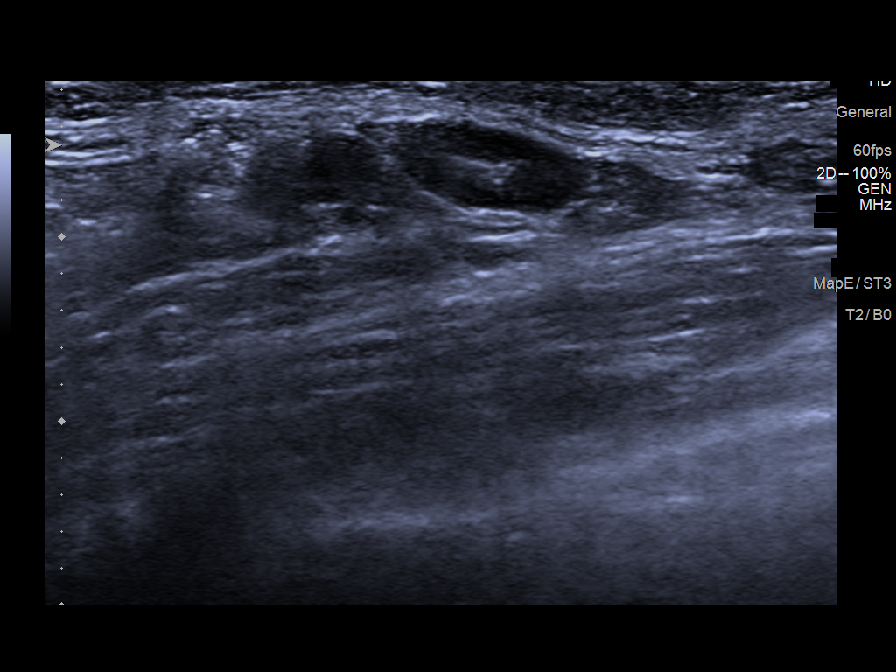
[im 8/9]
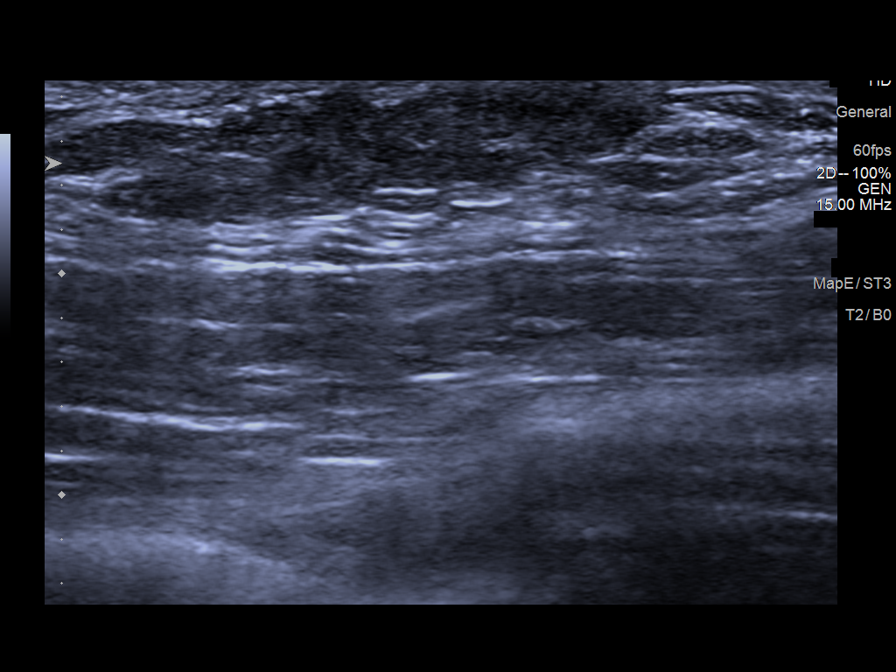
[im 9/9]
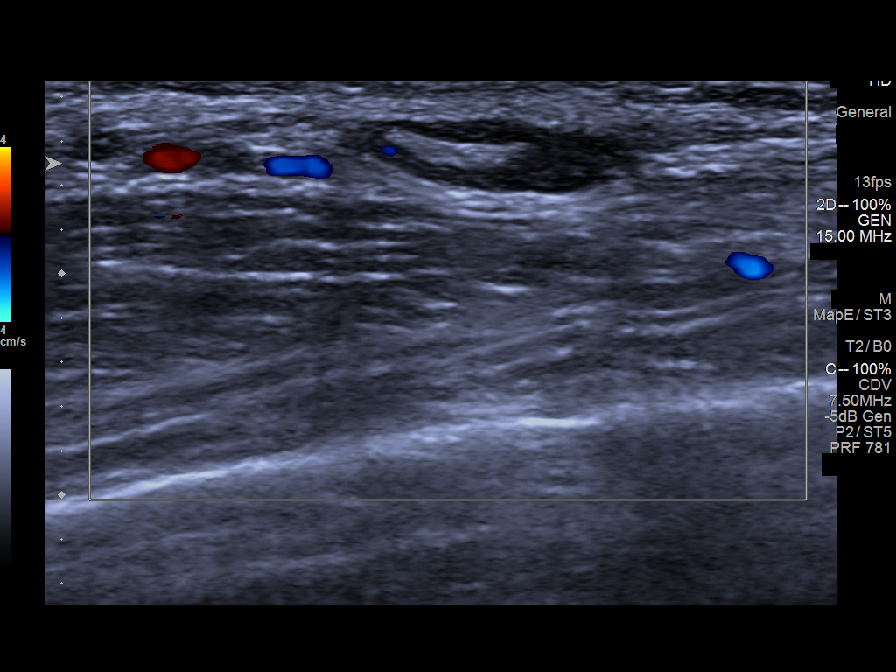

[9 of 9 positions shown; findings below may reference images not displayed]

FINDINGS: Mammographically, there are no suspicious masses, areas of
architectural distortion or microcalcifications in the left breast.
There is a low-density focal asymmetry in the right subareolar
breast, which corresponds to the area of palpable concern. Slightly
prominent right axillary lymph node is noted.

Mammographic images were processed with CAD.

On physical exam, there is a soft palpable area in the subareolar
right breast.

Targeted right breast ultrasound is performed, showing a small
amount of breast tissue development in the immediate subareolar
right breast, consistent with gynecomastia. No suspicious masses are
seen.

Examination of the right axilla demonstrates normal-appearing lymph
nodes.
IMPRESSION: Asymmetric right breast gynecomastia.

No mammographic evidence of malignancy in either breast.

RECOMMENDATION:
Clinical follow-up.

I have discussed the findings and recommendations with the patient.
Results were also provided in writing at the conclusion of the
visit. If applicable, a reminder letter will be sent to the patient
regarding the next appointment.

BI-RADS CATEGORY  2: Benign.

## 2021-01-18 IMAGING — MG DIGITAL DIAGNOSTIC BILATERAL MAMMOGRAM WITH TOMO AND CAD
6 of 12 series · 6 of 36 positions shown · non-contrast
Comparison: Previous exam(s).

ACR Breast Density Category a: The breast tissue is almost entirely
fatty.

CLINICAL DATA: Right subareolar breast area of palpable concern and
tenderness.

EXAM:
DIGITAL DIAGNOSTIC BILATERAL MAMMOGRAM WITH CAD AND TOMO
ULTRASOUND RIGHT BREAST

[R CC synth-2D (1 of 2)]
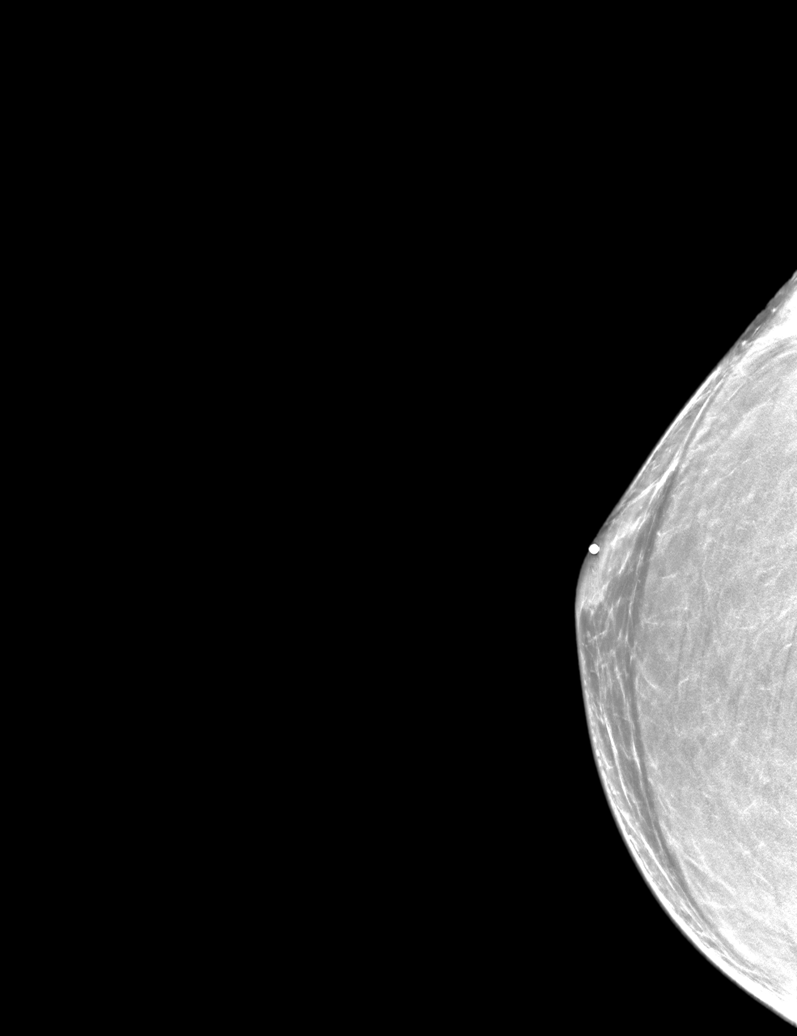

[R CC synth-2D (2 of 2)]
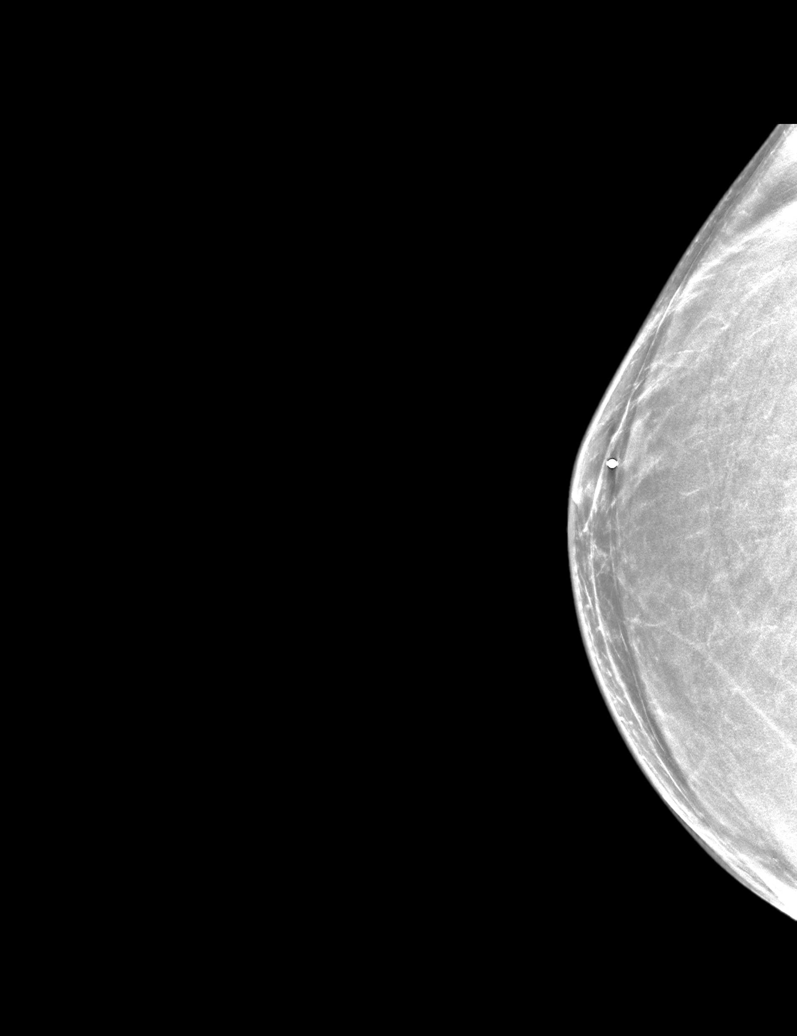

[R TAN synth-2D]
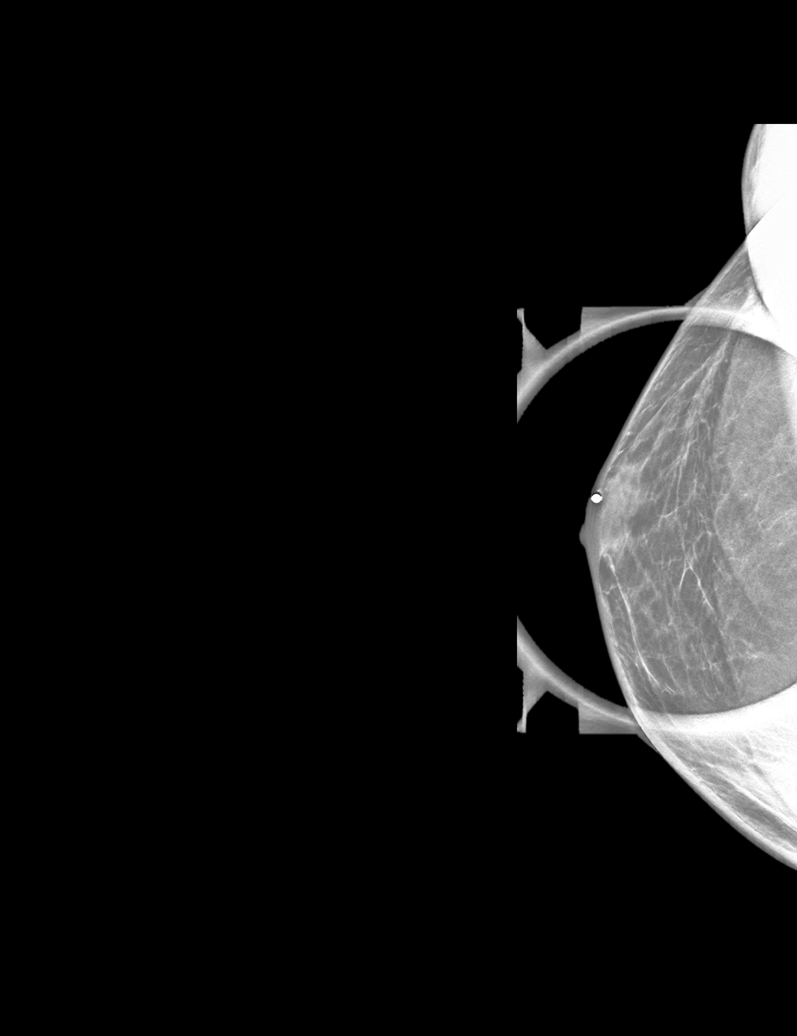

[R MLO synth-2D]
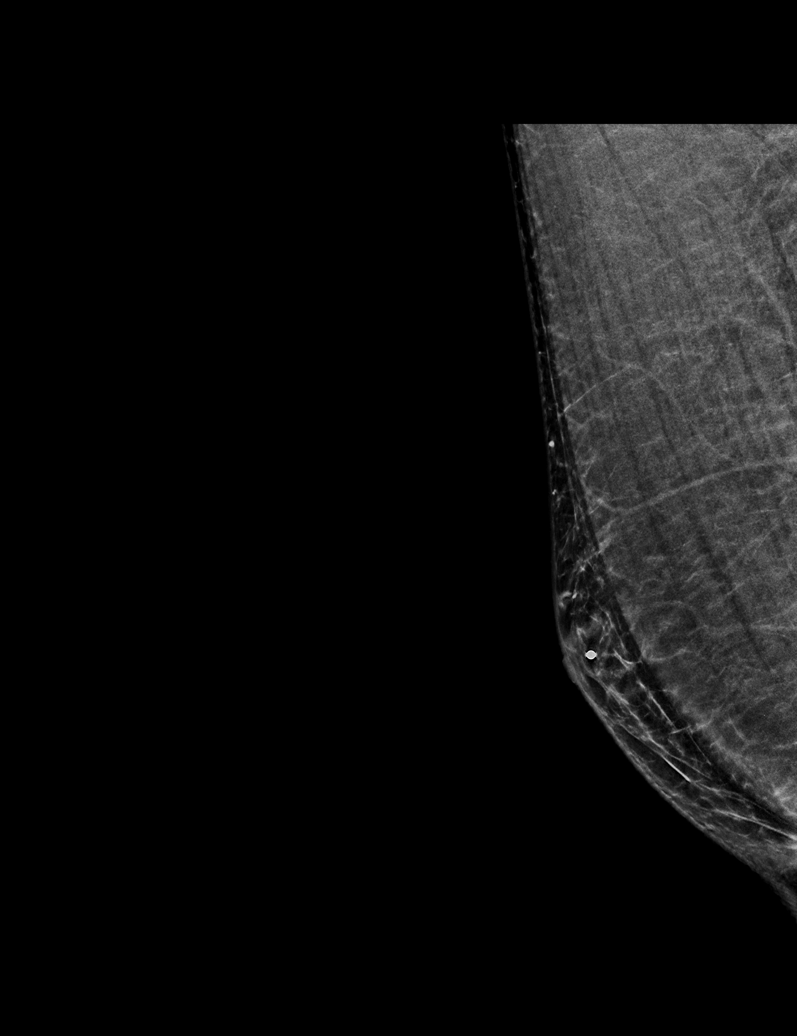

[L MLO synth-2D]
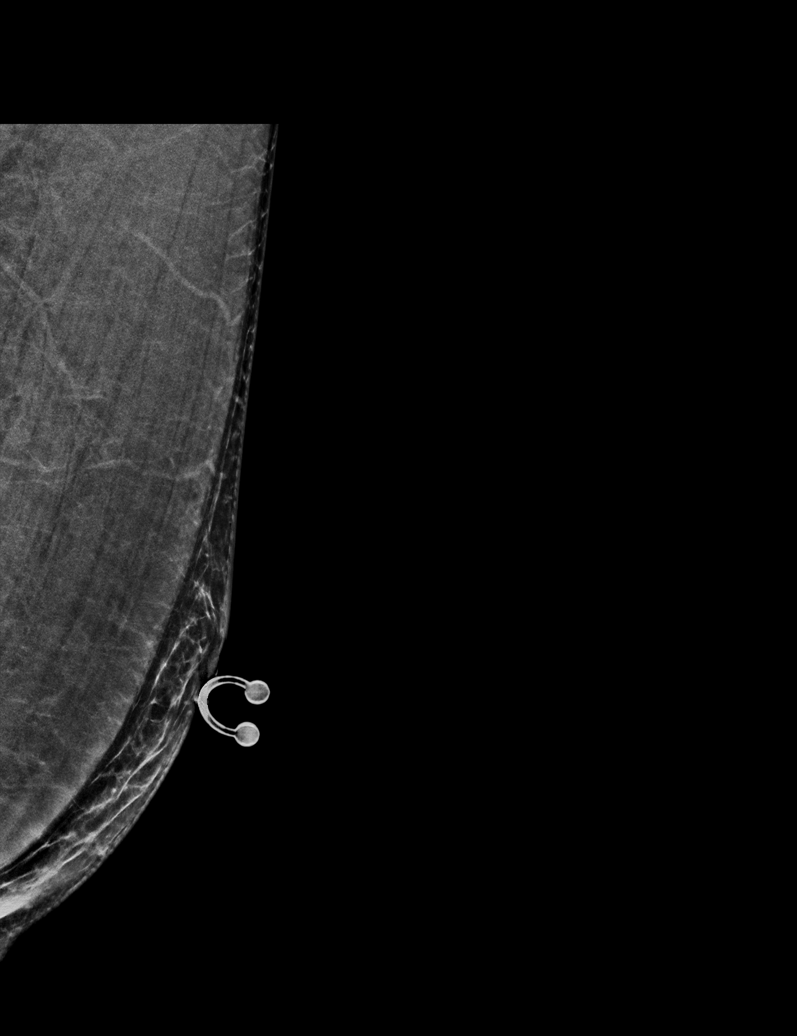

[L CC synth-2D]
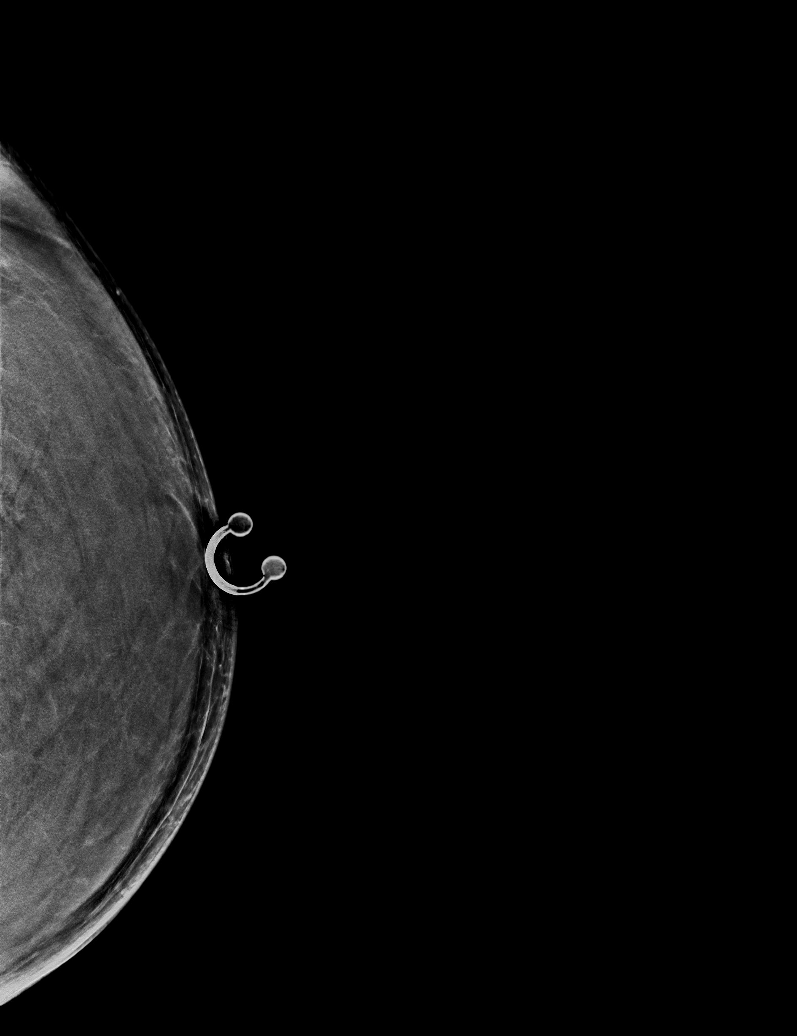

[6 of 36 positions shown; findings below may reference images not displayed]

FINDINGS: Mammographically, there are no suspicious masses, areas of
architectural distortion or microcalcifications in the left breast.
There is a low-density focal asymmetry in the right subareolar
breast, which corresponds to the area of palpable concern. Slightly
prominent right axillary lymph node is noted.

Mammographic images were processed with CAD.

On physical exam, there is a soft palpable area in the subareolar
right breast.

Targeted right breast ultrasound is performed, showing a small
amount of breast tissue development in the immediate subareolar
right breast, consistent with gynecomastia. No suspicious masses are
seen.

Examination of the right axilla demonstrates normal-appearing lymph
nodes.
IMPRESSION: Asymmetric right breast gynecomastia.

No mammographic evidence of malignancy in either breast.

RECOMMENDATION:
Clinical follow-up.

I have discussed the findings and recommendations with the patient.
Results were also provided in writing at the conclusion of the
visit. If applicable, a reminder letter will be sent to the patient
regarding the next appointment.

BI-RADS CATEGORY  2: Benign.

## 2021-01-29 ENCOUNTER — Ambulatory Visit: Payer: PRIVATE HEALTH INSURANCE | Admitting: Primary Care

## 2021-01-31 ENCOUNTER — Ambulatory Visit: Payer: PRIVATE HEALTH INSURANCE | Admitting: Primary Care

## 2021-01-31 ENCOUNTER — Ambulatory Visit (INDEPENDENT_AMBULATORY_CARE_PROVIDER_SITE_OTHER): Payer: BLUE CROSS/BLUE SHIELD | Admitting: Primary Care

## 2021-01-31 ENCOUNTER — Other Ambulatory Visit: Payer: Self-pay

## 2021-01-31 ENCOUNTER — Encounter: Payer: Self-pay | Admitting: Primary Care

## 2021-01-31 VITALS — BP 120/62 | HR 62 | Temp 98.8°F | Ht 67.5 in | Wt 159.0 lb

## 2021-01-31 DIAGNOSIS — Z0000 Encounter for general adult medical examination without abnormal findings: Secondary | ICD-10-CM | POA: Diagnosis not present

## 2021-01-31 DIAGNOSIS — Z114 Encounter for screening for human immunodeficiency virus [HIV]: Secondary | ICD-10-CM | POA: Diagnosis not present

## 2021-01-31 DIAGNOSIS — Z1159 Encounter for screening for other viral diseases: Secondary | ICD-10-CM | POA: Diagnosis not present

## 2021-01-31 DIAGNOSIS — E785 Hyperlipidemia, unspecified: Secondary | ICD-10-CM | POA: Diagnosis not present

## 2021-01-31 DIAGNOSIS — L649 Androgenic alopecia, unspecified: Secondary | ICD-10-CM | POA: Diagnosis not present

## 2021-01-31 DIAGNOSIS — D72819 Decreased white blood cell count, unspecified: Secondary | ICD-10-CM

## 2021-01-31 LAB — COMPREHENSIVE METABOLIC PANEL
ALT: 19 U/L (ref 0–53)
AST: 25 U/L (ref 0–37)
Albumin: 4.5 g/dL (ref 3.5–5.2)
Alkaline Phosphatase: 51 U/L (ref 39–117)
BUN: 35 mg/dL — ABNORMAL HIGH (ref 6–23)
CO2: 30 mEq/L (ref 19–32)
Calcium: 9.6 mg/dL (ref 8.4–10.5)
Chloride: 105 mEq/L (ref 96–112)
Creatinine, Ser: 1.22 mg/dL (ref 0.40–1.50)
GFR: 70.88 mL/min (ref >60.00)
Glucose, Bld: 69 mg/dL — ABNORMAL LOW (ref 70–99)
Potassium: 4.4 mEq/L (ref 3.5–5.1)
Sodium: 140 mEq/L (ref 135–145)
Total Bilirubin: 0.4 mg/dL (ref 0.2–1.2)
Total Protein: 6.4 g/dL (ref 6.0–8.3)

## 2021-01-31 LAB — LIPID PANEL
Cholesterol: 199 mg/dL (ref 0–200)
HDL: 58.3 mg/dL (ref >39.00)
LDL Cholesterol: 110 mg/dL — ABNORMAL HIGH (ref 0–99)
NonHDL: 141.03
Total CHOL/HDL Ratio: 3
Triglycerides: 155 mg/dL — ABNORMAL HIGH (ref 0.0–149.0)
VLDL: 31 mg/dL (ref 0.0–40.0)

## 2021-01-31 LAB — CBC
HCT: 42.2 % (ref 39.0–52.0)
Hemoglobin: 14.2 g/dL (ref 13.0–17.0)
MCHC: 33.6 g/dL (ref 30.0–36.0)
MCV: 91.1 fl (ref 78.0–100.0)
Platelets: 210 10*3/uL (ref 150.0–400.0)
RBC: 4.63 Mil/uL (ref 4.22–5.81)
RDW: 12.6 % (ref 11.5–15.5)
WBC: 6.2 10*3/uL (ref 4.0–10.5)

## 2021-01-31 MED ORDER — FINASTERIDE 1 MG PO TABS: 1.0000 mg | ORAL_TABLET | Freq: Every day | ORAL | 3 refills | Status: DC

## 2021-01-31 NOTE — Assessment & Plan Note (Signed)
Doing well on finasteride 1 mg daily, continue same.  Refill sent to pharmacy.

## 2021-01-31 NOTE — Assessment & Plan Note (Signed)
Repeat CBC pending. 

## 2021-01-31 NOTE — Assessment & Plan Note (Signed)
Tetanus up-to-date, declines influenza and Covid vaccinations.  Commended him on a healthy diet and regular exercise.  Exam today unremarkable. Labs pending.

## 2021-01-31 NOTE — Progress Notes (Signed)
Subjective:    Patient ID: Stanley Ponce, male    DOB: March 06, 1976, 45 y.o.   MRN: [id number]  HPI  This visit occurred during the SARS-CoV-2 public health emergency.  Safety protocols were in place, including screening questions prior to the visit, additional usage of staff PPE, and extensive cleaning of exam room while observing appropriate contact time as indicated for disinfecting solutions.   Mr. Stanley Ponce is a 45 year old male who presents today for complete physical.  Immunizations: -Tetanus: 2020 -Influenza: Declines  -Covid-19: Declines  Diet: He endorses a healthy diet.  Exercise: He is exercising regularly.   Eye exam: No recent exam Dental exam: No recent exam  Colonoscopy: Never completed, declines today. Hep C Screen: Due  BP Readings from Last 3 Encounters:  01/31/21 120/62  10/06/19 122/80  01/20/19 130/84     Review of Systems  Constitutional: Negative for unexpected weight change.  HENT: Negative for rhinorrhea.   Eyes: Negative for visual disturbance.  Respiratory: Negative for cough and shortness of breath.   Cardiovascular: Negative for chest pain.  Gastrointestinal: Negative for constipation and diarrhea.  Genitourinary: Negative for difficulty urinating.  Musculoskeletal: Negative for arthralgias and myalgias.  Skin: Negative for rash.  Allergic/Immunologic: Negative for environmental allergies.  Neurological: Negative for dizziness, numbness and headaches.  Psychiatric/Behavioral: The patient is not nervous/anxious.        Past Medical History:  Diagnosis Date  . Androgenic alopecia   . COVID-19 virus infection      Social History   Socioeconomic History  . Marital status: Single    Spouse name: Not on file  . Number of children: Not on file  . Years of education: Not on file  . Highest education level: Not on file  Occupational History  . Not on file  Tobacco Use  . Smoking status: Never Smoker  . Smokeless tobacco: Never  Used  Substance and Sexual Activity  . Alcohol use: Never  . Drug use: Not on file  . Sexual activity: Not on file  Other Topics Concern  . Not on file  Social History Narrative   Separated.    No children.   Works in physical therapy.   Enjoys exercising.   Social Determinants of Health   Financial Resource Strain: Not on file  Food Insecurity: Not on file  Transportation Needs: Not on file  Physical Activity: Not on file  Stress: Not on file  Social Connections: Not on file  Intimate Partner Violence: Not on file    History reviewed. No pertinent surgical history.  Family History  Problem Relation Age of Onset  . Diabetes Father   . Breast cancer Neg Hx     No Known Allergies  Current Outpatient Medications on File Prior to Visit  Medication Sig Dispense Refill  . finasteride (PROPECIA) 1 MG tablet Take 1 tablet (1 mg total) by mouth daily. 90 tablet 0   No current facility-administered medications on file prior to visit.    BP 120/62   Pulse 62   Temp 98.8 F (37.1 C) (Temporal)   Ht 5' 7.5" (1.715 m)   Wt 159 lb (72.1 kg)   SpO2 97%   BMI 24.54 kg/m    Objective:   Physical Exam Constitutional:      Appearance: He is well-nourished.  HENT:     Right Ear: Tympanic membrane and ear canal normal.     Left Ear: Tympanic membrane and ear canal normal.     Mouth/Throat:  Mouth: Oropharynx is clear and moist.  Eyes:     Extraocular Movements: EOM normal.     Pupils: Pupils are equal, round, and reactive to light.  Cardiovascular:     Rate and Rhythm: Normal rate and regular rhythm.  Pulmonary:     Effort: Pulmonary effort is normal.     Breath sounds: Normal breath sounds.  Abdominal:     General: Bowel sounds are normal.     Palpations: Abdomen is soft.     Tenderness: There is no abdominal tenderness.  Musculoskeletal:        General: Normal range of motion.     Cervical back: Neck supple.  Skin:    General: Skin is warm and dry.   Neurological:     Mental Status: He is alert and oriented to person, place, and time.     Cranial Nerves: No cranial nerve deficit.     Deep Tendon Reflexes:     Reflex Scores:      Patellar reflexes are 2+ on the right side and 2+ on the left side. Psychiatric:        Mood and Affect: Mood and affect normal.            Assessment & Plan:

## 2021-01-31 NOTE — Assessment & Plan Note (Signed)
Commended him on a healthy diet and regular exercise.  Repeat lipid panel pending. 

## 2021-01-31 NOTE — Patient Instructions (Signed)
Stop by the lab prior to leaving today. I will notify you of your results once received.   Continue exercising. You should be getting 150 minutes of moderate intensity exercise weekly.  Continue to work on a healthy diet. Ensure you are consuming 64 ounces of water daily.  It was a pleasure to see you today!   Preventive Care 40-47 Years Old, Male Preventive care refers to lifestyle choices and visits with your health care provider that can promote health and wellness. This includes:  A yearly physical exam. This is also called an annual wellness visit.  Regular dental and eye exams.  Immunizations.  Screening for certain conditions.  Healthy lifestyle choices, such as: ? Eating a healthy diet. ? Getting regular exercise. ? Not using drugs or products that contain nicotine and tobacco. ? Limiting alcohol use. What can I expect for my preventive care visit? Physical exam Your health care provider will check your:  Height and weight. These may be used to calculate your BMI (body mass index). BMI is a measurement that tells if you are at a healthy weight.  Heart rate and blood pressure.  Body temperature.  Skin for abnormal spots. Counseling Your health care provider may ask you questions about your:  Past medical problems.  Family's medical history.  Alcohol, tobacco, and drug use.  Emotional well-being.  Home life and relationship well-being.  Sexual activity.  Diet, exercise, and sleep habits.  Work and work environment.  Access to firearms. What immunizations do I need? Vaccines are usually given at various ages, according to a schedule. Your health care provider will recommend vaccines for you based on your age, medical history, and lifestyle or other factors, such as travel or where you work.   What tests do I need? Blood tests  Lipid and cholesterol levels. These may be checked every 5 years, or more often if you are over 50 years old.  Hepatitis C  test.  Hepatitis B test. Screening  Lung cancer screening. You may have this screening every year starting at age 55 if you have a 30-pack-year history of smoking and currently smoke or have quit within the past 15 years.  Prostate cancer screening. Recommendations will vary depending on your family history and other risks.  Genital exam to check for testicular cancer or hernias.  Colorectal cancer screening. ? All adults should have this screening starting at age 50 and continuing until age 75. ? Your health care provider may recommend screening at age 45 if you are at increased risk. ? You will have tests every 1-10 years, depending on your results and the type of screening test.  Diabetes screening. ? This is done by checking your blood sugar (glucose) after you have not eaten for a while (fasting). ? You may have this done every 1-3 years.  STD (sexually transmitted disease) testing, if you are at risk. Follow these instructions at home: Eating and drinking  Eat a diet that includes fresh fruits and vegetables, whole grains, lean protein, and low-fat dairy products.  Take vitamin and mineral supplements as recommended by your health care provider.  Do not drink alcohol if your health care provider tells you not to drink.  If you drink alcohol: ? Limit how much you have to 0-2 drinks a day. ? Be aware of how much alcohol is in your drink. In the [place] one drink equals one 12 oz bottle of beer (355 mL), one 5 oz glass of wine (148 mL), or one   1 oz glass of hard liquor (44 mL).   Lifestyle  Take daily care of your teeth and gums. Brush your teeth every morning and night with fluoride toothpaste. Floss one time each day.  Stay active. Exercise for at least 30 minutes 5 or more days each week.  Do not use any products that contain nicotine or tobacco, such as cigarettes, e-cigarettes, and chewing tobacco. If you need help quitting, ask your health care provider.  Do not use  drugs.  If you are sexually active, practice safe sex. Use a condom or other form of protection to prevent STIs (sexually transmitted infections).  If told by your health care provider, take low-dose aspirin daily starting at age 50.  Find healthy ways to cope with stress, such as: ? Meditation, yoga, or listening to music. ? Journaling. ? Talking to a trusted person. ? Spending time with friends and family. Safety  Always wear your seat belt while driving or riding in a vehicle.  Do not drive: ? If you have been drinking alcohol. Do not ride with someone who has been drinking. ? When you are tired or distracted. ? While texting.  Wear a helmet and other protective equipment during sports activities.  If you have firearms in your house, make sure you follow all gun safety procedures. What's next?  Go to your health care provider once a year for an annual wellness visit.  Ask your health care provider how often you should have your eyes and teeth checked.  Stay up to date on all vaccines. This information is not intended to replace advice given to you by your health care provider. Make sure you discuss any questions you have with your health care provider. Document Revised: 08/17/2019 Document Reviewed: 11/12/2018 Elsevier Patient Education  2021 Elsevier Inc.  

## 2021-02-01 LAB — HEPATITIS C ANTIBODY
Hepatitis C Ab: NONREACTIVE
SIGNAL TO CUT-OFF: 0.01 (ref <1.00)

## 2021-02-01 LAB — HIV ANTIBODY (ROUTINE TESTING W REFLEX): HIV 1&2 Ab, 4th Generation: NONREACTIVE

## 2021-05-01 ENCOUNTER — Other Ambulatory Visit: Payer: Self-pay | Admitting: Primary Care

## 2021-05-01 DIAGNOSIS — L649 Androgenic alopecia, unspecified: Secondary | ICD-10-CM

## 2021-05-01 NOTE — Telephone Encounter (Signed)
Will you double check his preferred pharmacy? Okay to send in as pended.

## 2021-05-02 NOTE — Telephone Encounter (Signed)
Called did need [facility]. Have sent in refill as pended

## 2022-02-04 ENCOUNTER — Other Ambulatory Visit: Payer: Self-pay | Admitting: Primary Care

## 2022-02-04 DIAGNOSIS — L649 Androgenic alopecia, unspecified: Secondary | ICD-10-CM

## 2022-02-04 NOTE — Telephone Encounter (Signed)
Patient last office visit was CPE on 01/31/21. Not seen in year. Left message to call office. Need to make CPE appointment.  ?

## 2022-02-06 NOTE — Telephone Encounter (Signed)
Noted. ?Will decline refill request at this time. ?

## 2022-02-06 NOTE — Telephone Encounter (Signed)
Called patient declined making appointment states he has a few weeks left and will call back in a week or so to make appointment.  ?

## 2022-04-12 ENCOUNTER — Ambulatory Visit (INDEPENDENT_AMBULATORY_CARE_PROVIDER_SITE_OTHER): Payer: BLUE CROSS/BLUE SHIELD | Admitting: Primary Care

## 2022-04-12 ENCOUNTER — Encounter: Payer: Self-pay | Admitting: Primary Care

## 2022-04-12 VITALS — BP 110/64 | HR 76 | Temp 98.7°F | Ht 67.5 in | Wt 164.0 lb

## 2022-04-12 DIAGNOSIS — Z1211 Encounter for screening for malignant neoplasm of colon: Secondary | ICD-10-CM | POA: Diagnosis not present

## 2022-04-12 DIAGNOSIS — Z0000 Encounter for general adult medical examination without abnormal findings: Secondary | ICD-10-CM

## 2022-04-12 DIAGNOSIS — L649 Androgenic alopecia, unspecified: Secondary | ICD-10-CM

## 2022-04-12 DIAGNOSIS — E785 Hyperlipidemia, unspecified: Secondary | ICD-10-CM | POA: Diagnosis not present

## 2022-04-12 LAB — LIPID PANEL
Cholesterol: 178 mg/dL (ref 0–200)
HDL: 51.2 mg/dL (ref >39.00)
LDL Cholesterol: 112 mg/dL — ABNORMAL HIGH (ref 0–99)
NonHDL: 127.09
Total CHOL/HDL Ratio: 3
Triglycerides: 76 mg/dL (ref 0.0–149.0)
VLDL: 15.2 mg/dL (ref 0.0–40.0)

## 2022-04-12 LAB — COMPREHENSIVE METABOLIC PANEL
ALT: 22 U/L (ref 0–53)
AST: 26 U/L (ref 0–37)
Albumin: 4.6 g/dL (ref 3.5–5.2)
Alkaline Phosphatase: 44 U/L (ref 39–117)
BUN: 18 mg/dL (ref 6–23)
CO2: 31 mEq/L (ref 19–32)
Calcium: 9.6 mg/dL (ref 8.4–10.5)
Chloride: 103 mEq/L (ref 96–112)
Creatinine, Ser: 1.21 mg/dL (ref 0.40–1.50)
GFR: 70.99 mL/min (ref >60.00)
Glucose, Bld: 75 mg/dL (ref 70–99)
Potassium: 4.2 mEq/L (ref 3.5–5.1)
Sodium: 141 mEq/L (ref 135–145)
Total Bilirubin: 0.6 mg/dL (ref 0.2–1.2)
Total Protein: 6.7 g/dL (ref 6.0–8.3)

## 2022-04-12 LAB — CBC
HCT: 40.4 % (ref 39.0–52.0)
Hemoglobin: 13.9 g/dL (ref 13.0–17.0)
MCHC: 34.3 g/dL (ref 30.0–36.0)
MCV: 93 fl (ref 78.0–100.0)
Platelets: 137 10*3/uL — ABNORMAL LOW (ref 150.0–400.0)
RBC: 4.35 Mil/uL (ref 4.22–5.81)
RDW: 13.5 % (ref 11.5–15.5)
WBC: 4.5 10*3/uL (ref 4.0–10.5)

## 2022-04-12 MED ORDER — FINASTERIDE 1 MG PO TABS: 1.0000 mg | ORAL_TABLET | Freq: Every day | ORAL | 3 refills | Status: AC

## 2022-04-12 NOTE — Assessment & Plan Note (Signed)
Controlled. ? ?Continue finasteride 1 mg daily. ? ?

## 2022-04-12 NOTE — Patient Instructions (Signed)
Stop by the lab prior to leaving today. I will notify you of your results once received.  ? ?Complete the cologuard kit once received. ? ?It was a pleasure to see you today! ? ?Preventive Care 75-48 Years Old, Male ?Preventive care refers to lifestyle choices and visits with your health care provider that can promote health and wellness. Preventive care visits are also called wellness exams. ?What can I expect for my preventive care visit? ?Counseling ?During your preventive care visit, your health care provider may ask about your: ?Medical history, including: ?Past medical problems. ?Family medical history. ?Current health, including: ?Emotional well-being. ?Home life and relationship well-being. ?Sexual activity. ?Lifestyle, including: ?Alcohol, nicotine or tobacco, and drug use. ?Access to firearms. ?Diet, exercise, and sleep habits. ?Safety issues such as seatbelt and bike helmet use. ?Sunscreen use. ?Work and work Statistician. ?Physical exam ?Your health care provider will check your: ?Height and weight. These may be used to calculate your BMI (body mass index). BMI is a measurement that tells if you are at a healthy weight. ?Waist circumference. This measures the distance around your waistline. This measurement also tells if you are at a healthy weight and may help predict your risk of certain diseases, such as type 2 diabetes and high blood pressure. ?Heart rate and blood pressure. ?Body temperature. ?Skin for abnormal spots. ?What immunizations do I need? ? ?Vaccines are usually given at various ages, according to a schedule. Your health care provider will recommend vaccines for you based on your age, medical history, and lifestyle or other factors, such as travel or where you work. ?What tests do I need? ?Screening ?Your health care provider may recommend screening tests for certain conditions. This may include: ?Lipid and cholesterol levels. ?Diabetes screening. This is done by checking your blood sugar  (glucose) after you have not eaten for a while (fasting). ?Hepatitis B test. ?Hepatitis C test. ?HIV (human immunodeficiency virus) test. ?STI (sexually transmitted infection) testing, if you are at risk. ?Lung cancer screening. ?Prostate cancer screening. ?Colorectal cancer screening. ?Talk with your health care provider about your test results, treatment options, and if necessary, the need for more tests. ?Follow these instructions at home: ?Eating and drinking ? ?Eat a diet that includes fresh fruits and vegetables, whole grains, lean protein, and low-fat dairy products. ?Take vitamin and mineral supplements as recommended by your health care provider. ?Do not drink alcohol if your health care provider tells you not to drink. ?If you drink alcohol: ?Limit how much you have to 0-2 drinks a day. ?Know how much alcohol is in your drink. In the [place] one drink equals one 12 oz bottle of beer (355 mL), one 5 oz glass of wine (148 mL), or one 1? oz glass of hard liquor (44 mL). ?Lifestyle ?Brush your teeth every morning and night with fluoride toothpaste. Floss one time each day. ?Exercise for at least 30 minutes 5 or more days each week. ?Do not use any products that contain nicotine or tobacco. These products include cigarettes, chewing tobacco, and vaping devices, such as e-cigarettes. If you need help quitting, ask your health care provider. ?Do not use drugs. ?If you are sexually active, practice safe sex. Use a condom or other form of protection to prevent STIs. ?Take aspirin only as told by your health care provider. Make sure that you understand how much to take and what form to take. Work with your health care provider to find out whether it is safe and beneficial for you to take  aspirin daily. ?Find healthy ways to manage stress, such as: ?Meditation, yoga, or listening to music. ?Journaling. ?Talking to a trusted person. ?Spending time with friends and family. ?Minimize exposure to UV radiation to reduce  your risk of skin cancer. ?Safety ?Always wear your seat belt while driving or riding in a vehicle. ?Do not drive: ?If you have been drinking alcohol. Do not ride with someone who has been drinking. ?When you are tired or distracted. ?While texting. ?If you have been using any mind-altering substances or drugs. ?Wear a helmet and other protective equipment during sports activities. ?If you have firearms in your house, make sure you follow all gun safety procedures. ?What's next? ?Go to your health care provider once a year for an annual wellness visit. ?Ask your health care provider how often you should have your eyes and teeth checked. ?Stay up to date on all vaccines. ?This information is not intended to replace advice given to you by your health care provider. Make sure you discuss any questions you have with your health care provider. ?Document Revised: 05/16/2021 Document Reviewed: 05/16/2021 ?Elsevier Patient Education ? [place]. ? ?

## 2022-04-12 NOTE — Progress Notes (Signed)
? ?Subjective:  ? ? Patient ID: Stanley Ponce, male    DOB: 01-Mar-1976, 46 y.o.   MRN: [id number] ? ?HPI ? ?Stanley Ponce is a very pleasant 46 y.o. male who presents today for complete physical and follow up of chronic conditions. ? ?Immunizations: ?-Tetanus: 2020 ?-Influenza: Did not complete last season ?-Covid-19: Never completed ? ?Diet: Healthy diet.  ?Exercise: Regular exercise. ? ?Eye exam: Completes every 2 years.  ?Dental exam: Completes semi-annually  ? ?Colonoscopy: Never completed, declined last year.  Opts for Cologuard. ? ?BP Readings from Last 3 Encounters:  ?04/12/22 110/64  ?01/31/21 120/62  ?10/06/19 122/80  ? ? ? ?Review of Systems  ?Constitutional:  Negative for unexpected weight change.  ?HENT:  Negative for rhinorrhea.   ?Respiratory:  Negative for cough and shortness of breath.   ?Cardiovascular:  Negative for chest pain.  ?Gastrointestinal:  Negative for constipation and diarrhea.  ?Genitourinary:  Negative for difficulty urinating.  ?Musculoskeletal:  Negative for arthralgias and myalgias.  ?Skin:  Negative for rash.  ?Allergic/Immunologic: Negative for environmental allergies.  ?Neurological:  Negative for dizziness and headaches.  ?Psychiatric/Behavioral:  The patient is not nervous/anxious.   ? ?   ? ? ?Past Medical History:  ?Diagnosis Date  ? Androgenic alopecia   ? COVID-19 virus infection   ? ? ?Social History  ? ?Socioeconomic History  ? Marital status: Single  ?  Spouse name: Not on file  ? Number of children: Not on file  ? Years of education: Not on file  ? Highest education level: Not on file  ?Occupational History  ? Not on file  ?Tobacco Use  ? Smoking status: Never  ? Smokeless tobacco: Never  ?Substance and Sexual Activity  ? Alcohol use: Never  ? Drug use: Not on file  ? Sexual activity: Not on file  ?Other Topics Concern  ? Not on file  ?Social History Narrative  ? Separated.   ? No children.  ? Works in physical therapy.  ? Enjoys exercising.  ? ?Social Determinants  of Health  ? ?Financial Resource Strain: Not on file  ?Food Insecurity: Not on file  ?Transportation Needs: Not on file  ?Physical Activity: Not on file  ?Stress: Not on file  ?Social Connections: Not on file  ?Intimate Partner Violence: Not on file  ? ? ?History reviewed. No pertinent surgical history. ? ?Family History  ?Problem Relation Age of Onset  ? Diabetes Father   ? Breast cancer Neg Hx   ? ? ?No Known Allergies ? ?Current Outpatient Medications on File Prior to Visit  ?Medication Sig Dispense Refill  ? finasteride (PROPECIA) 1 MG tablet TAKE 1 TABLET BY MOUTH EVERY DAY 90 tablet 2  ? ?No current facility-administered medications on file prior to visit.  ? ? ?BP 110/64   Pulse 76   Temp 98.7 ?F (37.1 ?C) (Oral)   Ht 5' 7.5" (1.715 m)   Wt 164 lb (74.4 kg)   SpO2 99%   BMI 25.31 kg/m?  ?Objective:  ? Physical Exam ?HENT:  ?   Right Ear: Tympanic membrane and ear canal normal.  ?   Left Ear: Tympanic membrane and ear canal normal.  ?   Nose: Nose normal.  ?   Right Sinus: No maxillary sinus tenderness or frontal sinus tenderness.  ?   Left Sinus: No maxillary sinus tenderness or frontal sinus tenderness.  ?Eyes:  ?   Conjunctiva/sclera: Conjunctivae normal.  ?Neck:  ?   Thyroid: No thyromegaly.  ?  Vascular: No carotid bruit.  ?Cardiovascular:  ?   Rate and Rhythm: Normal rate and regular rhythm.  ?   Heart sounds: Normal heart sounds.  ?Pulmonary:  ?   Effort: Pulmonary effort is normal.  ?   Breath sounds: Normal breath sounds. No wheezing or rales.  ?Abdominal:  ?   General: Bowel sounds are normal.  ?   Palpations: Abdomen is soft.  ?   Tenderness: There is no abdominal tenderness.  ?Musculoskeletal:     ?   General: Normal range of motion.  ?   Cervical back: Neck supple.  ?Skin: ?   General: Skin is warm and dry.  ?Neurological:  ?   Mental Status: He is alert and oriented to person, place, and time.  ?   Cranial Nerves: No cranial nerve deficit.  ?   Deep Tendon Reflexes: Reflexes are normal and  symmetric.  ?Psychiatric:     ?   Mood and Affect: Mood normal.  ? ? ? ? ? ?   ?Assessment & Plan:  ? ? ? ? ?This visit occurred during the SARS-CoV-2 public health emergency.  Safety protocols were in place, including screening questions prior to the visit, additional usage of staff PPE, and extensive cleaning of exam room while observing appropriate contact time as indicated for disinfecting solutions.  ?

## 2022-04-12 NOTE — Assessment & Plan Note (Signed)
Tetanus UTD. ? ?Colonoscopy overdue, he agrees to try Cologuard. Will place orders. ? ?Commended him on a healthy diet and regular exercise.  ? ?Exam today stable ?Labs pending ?

## 2022-05-16 ENCOUNTER — Other Ambulatory Visit: Payer: Self-pay | Admitting: Primary Care

## 2022-05-16 DIAGNOSIS — D696 Thrombocytopenia, unspecified: Secondary | ICD-10-CM

## 2022-05-31 ENCOUNTER — Other Ambulatory Visit (INDEPENDENT_AMBULATORY_CARE_PROVIDER_SITE_OTHER): Payer: BLUE CROSS/BLUE SHIELD

## 2022-05-31 DIAGNOSIS — D696 Thrombocytopenia, unspecified: Secondary | ICD-10-CM

## 2022-05-31 LAB — CBC WITH DIFFERENTIAL/PLATELET
Absolute Monocytes: 410 cells/uL (ref 200–950)
Basophils Absolute: 40 cells/uL (ref 0–200)
Basophils Relative: 0.7 %
Eosinophils Absolute: 91 cells/uL (ref 15–500)
Eosinophils Relative: 1.6 %
HCT: 40.4 % (ref 38.5–50.0)
Hemoglobin: 13.5 g/dL (ref 13.2–17.1)
Lymphs Abs: 2457 cells/uL (ref 850–3900)
MCH: 31.5 pg (ref 27.0–33.0)
MCHC: 33.4 g/dL (ref 32.0–36.0)
MCV: 94.2 fL (ref 80.0–100.0)
MPV: 12.8 fL — ABNORMAL HIGH (ref 7.5–12.5)
Monocytes Relative: 7.2 %
Neutro Abs: 2702 cells/uL (ref 1500–7800)
Neutrophils Relative %: 47.4 %
Platelets: 197 10*3/uL (ref 140–400)
RBC: 4.29 10*6/uL (ref 4.20–5.80)
RDW: 12.7 % (ref 11.0–15.0)
Total Lymphocyte: 43.1 %
WBC: 5.7 10*3/uL (ref 3.8–10.8)

## 2022-10-02 LAB — COLOGUARD: COLOGUARD: NEGATIVE

## 2022-11-11 ENCOUNTER — Ambulatory Visit: Payer: BLUE CROSS/BLUE SHIELD | Admitting: Primary Care

## 2023-12-10 ENCOUNTER — Ambulatory Visit: Payer: BLUE CROSS/BLUE SHIELD | Admitting: Primary Care

## 2023-12-10 VITALS — BP 124/78 | HR 79 | Temp 98.2°F | Ht 67.5 in | Wt 164.0 lb

## 2023-12-10 DIAGNOSIS — D72819 Decreased white blood cell count, unspecified: Secondary | ICD-10-CM

## 2023-12-10 DIAGNOSIS — E785 Hyperlipidemia, unspecified: Secondary | ICD-10-CM

## 2023-12-10 DIAGNOSIS — Z0000 Encounter for general adult medical examination without abnormal findings: Secondary | ICD-10-CM | POA: Diagnosis not present

## 2023-12-10 DIAGNOSIS — L649 Androgenic alopecia, unspecified: Secondary | ICD-10-CM | POA: Diagnosis not present

## 2023-12-10 DIAGNOSIS — D696 Thrombocytopenia, unspecified: Secondary | ICD-10-CM

## 2023-12-10 LAB — COMPREHENSIVE METABOLIC PANEL
ALT: 22 U/L (ref 0–53)
AST: 24 U/L (ref 0–37)
Albumin: 4.5 g/dL (ref 3.5–5.2)
Alkaline Phosphatase: 54 U/L (ref 39–117)
BUN: 33 mg/dL — ABNORMAL HIGH (ref 6–23)
CO2: 29 meq/L (ref 19–32)
Calcium: 9.3 mg/dL (ref 8.4–10.5)
Chloride: 105 meq/L (ref 96–112)
Creatinine, Ser: 0.92 mg/dL (ref 0.40–1.50)
GFR: 97.48 mL/min (ref >60.00)
Glucose, Bld: 88 mg/dL (ref 70–99)
Potassium: 4.3 meq/L (ref 3.5–5.1)
Sodium: 140 meq/L (ref 135–145)
Total Bilirubin: 0.4 mg/dL (ref 0.2–1.2)
Total Protein: 6.1 g/dL (ref 6.0–8.3)

## 2023-12-10 LAB — CBC WITH DIFFERENTIAL/PLATELET
Basophils Absolute: 0 10*3/uL (ref 0.0–0.1)
Basophils Relative: 0.9 % (ref 0.0–3.0)
Eosinophils Absolute: 0.1 10*3/uL (ref 0.0–0.7)
Eosinophils Relative: 2.2 % (ref 0.0–5.0)
HCT: 40.9 % (ref 39.0–52.0)
Hemoglobin: 13.4 g/dL (ref 13.0–17.0)
Lymphocytes Relative: 40.2 % (ref 12.0–46.0)
Lymphs Abs: 1.7 10*3/uL (ref 0.7–4.0)
MCHC: 32.9 g/dL (ref 30.0–36.0)
MCV: 94.5 fL (ref 78.0–100.0)
Monocytes Absolute: 0.3 10*3/uL (ref 0.1–1.0)
Monocytes Relative: 7.5 % (ref 3.0–12.0)
Neutro Abs: 2.1 10*3/uL (ref 1.4–7.7)
Neutrophils Relative %: 49.2 % (ref 43.0–77.0)
Platelets: 166 10*3/uL (ref 150.0–400.0)
RBC: 4.33 Mil/uL (ref 4.22–5.81)
RDW: 13.6 % (ref 11.5–15.5)
WBC: 4.2 10*3/uL (ref 4.0–10.5)

## 2023-12-10 LAB — IBC + FERRITIN
Ferritin: 86.1 ng/mL (ref 22.0–322.0)
Iron: 58 ug/dL (ref 42–165)
Saturation Ratios: 21.5 % (ref 20.0–50.0)
TIBC: 270.2 ug/dL (ref 250.0–450.0)
Transferrin: 193 mg/dL — ABNORMAL LOW (ref 212.0–360.0)

## 2023-12-10 LAB — LIPID PANEL
Cholesterol: 327 mg/dL — ABNORMAL HIGH (ref 0–200)
HDL: 71.2 mg/dL (ref >39.00)
LDL Cholesterol: 241 mg/dL — ABNORMAL HIGH (ref 0–99)
NonHDL: 256.01
Total CHOL/HDL Ratio: 5
Triglycerides: 77 mg/dL (ref 0.0–149.0)
VLDL: 15.4 mg/dL (ref 0.0–40.0)

## 2023-12-10 LAB — VITAMIN D 25 HYDROXY (VIT D DEFICIENCY, FRACTURES): VITD: 34.67 ng/mL (ref 30.00–100.00)

## 2023-12-10 NOTE — Assessment & Plan Note (Signed)
Stable. Repeat CBC pending.

## 2023-12-10 NOTE — Assessment & Plan Note (Signed)
 Immunizations UTD. Declines influenza vaccine.  Colon cancer screening up-to-date, due 2026  Discussed the importance of a healthy diet and regular exercise in order for weight loss, and to reduce the risk of further co-morbidity.  Exam stable. Labs pending.  Follow up in 1 year for repeat physical.

## 2023-12-10 NOTE — Patient Instructions (Signed)
 Stop by the lab prior to leaving today. I will notify you of your results once received.   It was a pleasure to see you today!

## 2023-12-10 NOTE — Assessment & Plan Note (Signed)
 Stable.  Continue finasteride-minoxidil 1.2 mg daily. Following with [facility].

## 2023-12-10 NOTE — Assessment & Plan Note (Signed)
 Repeat lipid panel pending. Commended him on regular exercise

## 2023-12-10 NOTE — Progress Notes (Signed)
 Subjective:    Patient ID: Stanley Ponce, male    DOB: [birth year], 48 y.o.   MRN: [id number]  HPI  Stanley Ponce is a very pleasant 48 y.o. male who presents today for complete physical and follow up of chronic conditions.  Immunizations: -Tetanus: Completed in 2020 -Influenza: Declines influenza vaccine.   Diet: Fair diet.  Exercise: Exercising 5 days weekly   Eye exam: Completed years ago  Dental exam: Completed years ago   Colonoscopy: Completed Cologuard in 2023, negative  BP Readings from Last 3 Encounters:  12/10/23 124/78  04/12/22 110/64  01/31/21 120/62       Review of Systems  Constitutional:  Negative for unexpected weight change.  HENT:  Negative for rhinorrhea.   Respiratory:  Negative for cough and shortness of breath.   Cardiovascular:  Negative for chest pain.  Gastrointestinal:  Negative for constipation and diarrhea.  Genitourinary:  Negative for difficulty urinating.  Musculoskeletal:  Negative for arthralgias and myalgias.  Skin:  Negative for rash.  Allergic/Immunologic: Negative for environmental allergies.  Neurological:  Negative for dizziness, numbness and headaches.  Psychiatric/Behavioral:  The patient is not nervous/anxious.          Past Medical History:  Diagnosis Date   Androgenic alopecia    COVID-19 virus infection    Phlebitis 01/20/2019    Social History   Socioeconomic History   Marital status: Single    Spouse name: Not on file   Number of children: Not on file   Years of education: Not on file   Highest education level: Associate degree: occupational, scientist, product/process development, or vocational program  Occupational History   Not on file  Tobacco Use   Smoking status: Never   Smokeless tobacco: Never  Substance and Sexual Activity   Alcohol use: Never   Drug use: Not on file   Sexual activity: Not on file  Other Topics Concern   Not on file  Social History Narrative   Separated.    No children.   Works in physical  therapy.   Enjoys exercising.   Social Drivers of Corporate Investment Banker Strain: Low Risk  (12/10/2023)   Overall Financial Resource Strain (CARDIA)    Difficulty of Paying Living Expenses: Not hard at all  Food Insecurity: No Food Insecurity (12/10/2023)   Hunger Vital Sign    Worried About Running Out of Food in the Last Year: Never true    Ran Out of Food in the Last Year: Never true  Transportation Needs: No Transportation Needs (12/10/2023)   PRAPARE - Administrator, Civil Service (Medical): No    Lack of Transportation (Non-Medical): No  Physical Activity: Sufficiently Active (12/10/2023)   Exercise Vital Sign    Days of Exercise per Week: 5 days    Minutes of Exercise per Session: 60 min  Stress: No Stress Concern Present (12/10/2023)   [employer] of Occupational Health - Occupational Stress Questionnaire    Feeling of Stress : Not at all  Social Connections: Socially Isolated (12/10/2023)   Social Connection and Isolation Panel [NHANES]    Frequency of Communication with Friends and Family: Three times a week    Frequency of Social Gatherings with Friends and Family: Never    Attends Religious Services: Never    Database Administrator or Organizations: No    Attends Engineer, Structural: Not on file    Marital Status: Divorced  Intimate Partner Violence: Not on file    History  reviewed. No pertinent surgical history.  Family History  Problem Relation Age of Onset   Diabetes Father    Breast cancer Neg Hx     No Known Allergies  Current Outpatient Medications on File Prior to Visit  Medication Sig Dispense Refill   NON FORMULARY Take by mouth daily. Finasteride  1.2 mg + Minoxidil 3mg  + Biotin Tablet     finasteride  (PROPECIA ) 1 MG tablet Take 1 tablet (1 mg total) by mouth daily. (Patient not taking: Reported on 12/10/2023) 90 tablet 3   No current facility-administered medications on file prior to visit.    BP 124/78   Pulse 79   Temp  98.2 F (36.8 C) (Temporal)   Ht 5' 7.5 (1.715 m)   Wt 164 lb (74.4 kg)   SpO2 99%   BMI 25.31 kg/m  Objective:   Physical Exam HENT:     Right Ear: Tympanic membrane and ear canal normal.     Left Ear: Tympanic membrane and ear canal normal.  Eyes:     Pupils: Pupils are equal, round, and reactive to light.  Cardiovascular:     Rate and Rhythm: Normal rate and regular rhythm.  Pulmonary:     Effort: Pulmonary effort is normal.     Breath sounds: Normal breath sounds.  Abdominal:     General: Bowel sounds are normal.     Palpations: Abdomen is soft.     Tenderness: There is no abdominal tenderness.  Musculoskeletal:        General: Normal range of motion.     Cervical back: Neck supple.  Skin:    General: Skin is warm and dry.  Neurological:     Mental Status: He is alert and oriented to person, place, and time.     Cranial Nerves: No cranial nerve deficit.     Deep Tendon Reflexes:     Reflex Scores:      Patellar reflexes are 2+ on the right side and 2+ on the left side. Psychiatric:        Mood and Affect: Mood normal.           Assessment & Plan:  Preventative health care Assessment & Plan: Immunizations UTD. Declines influenza vaccine.  Colon cancer screening up-to-date, due 2026  Discussed the importance of a healthy diet and regular exercise in order for weight loss, and to reduce the risk of further co-morbidity.  Exam stable. Labs pending.  Follow up in 1 year for repeat physical.    Androgenic alopecia Assessment & Plan: Stable.  Continue finasteride -minoxidil 1.2 mg daily. Following with [facility].   Hyperlipidemia, unspecified hyperlipidemia type Assessment & Plan: Repeat lipid panel pending. Commended him on regular exercise.   Orders: -     Lipid panel -     Comprehensive metabolic panel  Leukopenia, unspecified type Assessment & Plan: Stable. Repeat CBC pending.   Low platelet count (HCC) -     IBC + Ferritin -     CBC  with Differential/Platelet -     VITAMIN D  25 Hydroxy (Vit-D Deficiency, Fractures)        Carmelle Bamberg K Naidelin Gugliotta, NP

## 2024-06-29 ENCOUNTER — Telehealth: Payer: Self-pay

## 2024-06-29 DIAGNOSIS — E785 Hyperlipidemia, unspecified: Secondary | ICD-10-CM

## 2024-06-29 NOTE — Telephone Encounter (Signed)
 Spoke to pt, sch labs for 07/05/24

## 2024-06-29 NOTE — Telephone Encounter (Signed)
-   Noted.  Lab orders placed so that he can be scheduled for a lab only appointment.  He will need to fast 8 hours prior.  He may have water.

## 2024-06-29 NOTE — Telephone Encounter (Signed)
 Copied from CRM [id number]. Topic: Clinical - Request for Lab/Test Order >> Jun 29, 2024  8:15 AM Macario HERO wrote: Reason for CRM: Patient is requesting lipid panel because he said the was advised per last visit to come in and get it rechecked.

## 2024-06-29 NOTE — Addendum Note (Signed)
 Addended by: Tameisha Covell K on: 06/29/2024 11:07 AM   Modules accepted: Orders

## 2024-07-05 ENCOUNTER — Other Ambulatory Visit (INDEPENDENT_AMBULATORY_CARE_PROVIDER_SITE_OTHER)

## 2024-07-05 ENCOUNTER — Ambulatory Visit: Payer: Self-pay | Admitting: Primary Care

## 2024-07-05 DIAGNOSIS — E785 Hyperlipidemia, unspecified: Secondary | ICD-10-CM | POA: Diagnosis not present

## 2024-07-05 LAB — LIPID PANEL
Cholesterol: 178 mg/dL (ref 0–200)
HDL: 57.8 mg/dL (ref >39.00)
LDL Cholesterol: 107 mg/dL — ABNORMAL HIGH (ref 0–99)
NonHDL: 119.72
Total CHOL/HDL Ratio: 3
Triglycerides: 64 mg/dL (ref 0.0–149.0)
VLDL: 12.8 mg/dL (ref 0.0–40.0)

## 2024-07-09 LAB — LIPOPROTEIN A (LPA): Lipoprotein (a): 18 nmol/L (ref <75)
